# Patient Record
Sex: Male | Born: 1960 | Race: Black or African American | Hispanic: No | Marital: Married | Smoking: Former smoker
Health system: Southern US, Community
[De-identification: ages and names within clinical notes are randomized; demographics above are authoritative.]

## PROBLEM LIST (undated history)

## (undated) DIAGNOSIS — N401 Enlarged prostate with lower urinary tract symptoms: Secondary | ICD-10-CM

## (undated) DIAGNOSIS — I10 Essential (primary) hypertension: Secondary | ICD-10-CM

## (undated) DIAGNOSIS — G4730 Sleep apnea, unspecified: Secondary | ICD-10-CM

## (undated) DIAGNOSIS — G454 Transient global amnesia: Secondary | ICD-10-CM

## (undated) DIAGNOSIS — R7303 Prediabetes: Secondary | ICD-10-CM

## (undated) DIAGNOSIS — E7800 Pure hypercholesterolemia, unspecified: Secondary | ICD-10-CM

## (undated) HISTORY — PX: NO PAST SURGERIES: SHX2092

## (undated) HISTORY — PX: COLONOSCOPY: SHX174

---

## 2009-06-12 ENCOUNTER — Ambulatory Visit: Payer: Self-pay | Admitting: Otolaryngology

## 2010-11-08 ENCOUNTER — Ambulatory Visit: Payer: Self-pay

## 2011-08-12 ENCOUNTER — Ambulatory Visit: Payer: Self-pay | Admitting: Otolaryngology

## 2011-08-26 ENCOUNTER — Ambulatory Visit: Payer: Self-pay | Admitting: Otolaryngology

## 2012-11-17 ENCOUNTER — Ambulatory Visit: Payer: Self-pay | Admitting: Gastroenterology

## 2015-03-22 ENCOUNTER — Encounter: Payer: Self-pay | Admitting: Emergency Medicine

## 2015-03-22 ENCOUNTER — Inpatient Hospital Stay
Admission: EM | Admit: 2015-03-22 | Discharge: 2015-03-23 | DRG: 069 | Disposition: A | Discharge status: Home / Self Care | Payer: BC Managed Care – PPO | Attending: Internal Medicine | Admitting: Internal Medicine

## 2015-03-22 ENCOUNTER — Emergency Department: Payer: BC Managed Care – PPO

## 2015-03-22 DIAGNOSIS — Z79899 Other long term (current) drug therapy: Secondary | ICD-10-CM

## 2015-03-22 DIAGNOSIS — Z7982 Long term (current) use of aspirin: Secondary | ICD-10-CM

## 2015-03-22 DIAGNOSIS — Z87891 Personal history of nicotine dependence: Secondary | ICD-10-CM | POA: Diagnosis not present

## 2015-03-22 DIAGNOSIS — E785 Hyperlipidemia, unspecified: Secondary | ICD-10-CM | POA: Diagnosis present

## 2015-03-22 DIAGNOSIS — I10 Essential (primary) hypertension: Secondary | ICD-10-CM | POA: Diagnosis present

## 2015-03-22 DIAGNOSIS — G459 Transient cerebral ischemic attack, unspecified: Secondary | ICD-10-CM | POA: Diagnosis not present

## 2015-03-22 DIAGNOSIS — G4733 Obstructive sleep apnea (adult) (pediatric): Secondary | ICD-10-CM | POA: Diagnosis present

## 2015-03-22 HISTORY — DX: Essential (primary) hypertension: I10

## 2015-03-22 LAB — BASIC METABOLIC PANEL
ANION GAP: 13 (ref 5–15)
BUN: 22 mg/dL — AB (ref 6–20)
CHLORIDE: 100 mmol/L — AB (ref 101–111)
CO2: 23 mmol/L (ref 22–32)
Calcium: 9.4 mg/dL (ref 8.9–10.3)
Creatinine, Ser: 0.94 mg/dL (ref 0.61–1.24)
GFR calc Af Amer: >60 mL/min (ref >60)
GFR calc non Af Amer: >60 mL/min (ref >60)
Glucose, Bld: 103 mg/dL — ABNORMAL HIGH (ref 65–99)
Potassium: 3 mmol/L — ABNORMAL LOW (ref 3.5–5.1)
Sodium: 136 mmol/L (ref 135–145)

## 2015-03-22 LAB — CBC
HEMATOCRIT: 44.8 % (ref 40.0–52.0)
HEMOGLOBIN: 14.8 g/dL (ref 13.0–18.0)
MCH: 27.2 pg (ref 26.0–34.0)
MCHC: 33 g/dL (ref 32.0–36.0)
MCV: 82.3 fL (ref 80.0–100.0)
Platelets: 203 10*3/uL (ref 150–440)
RBC: 5.45 MIL/uL (ref 4.40–5.90)
RDW: 14.4 % (ref 11.5–14.5)
WBC: 6.6 10*3/uL (ref 3.8–10.6)

## 2015-03-22 MED ORDER — ASPIRIN EC 81 MG PO TBEC
81.0000 mg | DELAYED_RELEASE_TABLET | Freq: Every day | ORAL | Status: DC
  Administered 2015-03-23: 81 mg via ORAL
  Filled 2015-03-22: qty 1

## 2015-03-22 MED ORDER — ENOXAPARIN SODIUM 40 MG/0.4ML ~~LOC~~ SOLN
40.0000 mg | Freq: Two times a day (BID) | SUBCUTANEOUS | Status: DC
  Administered 2015-03-22 – 2015-03-23 (×2): 40 mg via SUBCUTANEOUS
  Filled 2015-03-22 (×2): qty 0.4

## 2015-03-22 MED ORDER — BENAZEPRIL HCL 20 MG PO TABS
40.0000 mg | ORAL_TABLET | Freq: Every day | ORAL | Status: DC
  Administered 2015-03-23: 40 mg via ORAL
  Filled 2015-03-22 (×2): qty 2

## 2015-03-22 MED ORDER — ACETAMINOPHEN 325 MG PO TABS
650.0000 mg | ORAL_TABLET | ORAL | Status: DC | PRN
  Administered 2015-03-23: 650 mg via ORAL
  Filled 2015-03-22: qty 2

## 2015-03-22 MED ORDER — NEBIVOLOL HCL 10 MG PO TABS
10.0000 mg | ORAL_TABLET | Freq: Every day | ORAL | Status: DC
  Administered 2015-03-23: 10 mg via ORAL
  Filled 2015-03-22: qty 1

## 2015-03-22 MED ORDER — AMLODIPINE BESYLATE 10 MG PO TABS
10.0000 mg | ORAL_TABLET | Freq: Every day | ORAL | Status: DC
  Administered 2015-03-23: 10 mg via ORAL
  Filled 2015-03-22: qty 1

## 2015-03-22 MED ORDER — STROKE: EARLY STAGES OF RECOVERY BOOK
Freq: Once | Status: AC
  Administered 2015-03-23: 03:00:00

## 2015-03-22 MED ORDER — HYDRALAZINE HCL 25 MG PO TABS
25.0000 mg | ORAL_TABLET | Freq: Three times a day (TID) | ORAL | Status: DC
  Administered 2015-03-22 – 2015-03-23 (×3): 25 mg via ORAL
  Filled 2015-03-22 (×3): qty 1

## 2015-03-22 NOTE — ED Provider Notes (Signed)
[facility] Emergency Department Provider Note  ____________________________________________  Time seen: Approximately 9:03 PM  I have reviewed the triage vital signs and the nursing notes.   HISTORY  Chief Complaint Near Syncope    HPI Shane MarseillesMelvin Todorov Jr. is a 54 y.o. 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 Hypertension     There are no active problems to display for this patient.   History reviewed. No pertinent past surgical history.  Current Outpatient Rx  Name  Route  Sig  Dispense  Refill  . amLODipine (NORVASC) 10 MG tablet   Oral   Take 10 mg by mouth daily.         Marland Kitchen. amoxicillin-clavulanate (AUGMENTIN) 875-125 MG per tablet   Oral   Take 1 tablet by mouth 2 (two) times daily.         Marland Kitchen. aspirin EC 81 MG tablet   Oral   Take 81 mg by mouth daily.         . benazepril (LOTENSIN) 40 MG tablet   Oral   Take 40 mg by mouth daily.         . hydrALAZINE (APRESOLINE) 25 MG tablet   Oral   Take 25 mg by mouth 3 (three) times daily.         . hydrochlorothiazide (HYDRODIURIL) 25 MG tablet   Oral   Take 25 mg by mouth daily.         . Multiple Vitamins-Minerals (MULTIVITAMIN PO)   Oral   Take 1 tablet by mouth daily.         . naproxen sodium  (ANAPROX) 220 MG tablet   Oral   Take 440 mg by mouth every morning.         . nebivolol (BYSTOLIC) 10 MG tablet   Oral   Take 10 mg by mouth daily.         . Omega-3 Fatty Acids (FISH OIL PO)   Oral   Take 2 capsules by mouth daily.           Allergies Review of patient's allergies indicates no known allergies.  No family history on file.  Social History History  Substance Use Topics  . Smoking status: Former Games developermoker  . Smokeless tobacco: Not on file  . Alcohol Use: No     Constitutional: No fever/chills Eyes: No visual changes. ENT: No sore throat. Cardiovascular: Denies chest pain. Respiratory: Denies shortness of breath. Gastrointestinal: No abdominal pain.  No nausea, no vomiting.  No diarrhea.  No constipation. Genitourinary: Negative for dysuria. Musculoskeletal: Negative for back pain. Skin: Negative for rash.  Review of systems is otherwise negative except as specified in history of present illness ____________________________________________   PHYSICAL  EXAM:  VITAL SIGNS: ED Triage Vitals  Enc Vitals Group     BP 03/22/15 1605 156/82 mmHg     Pulse Rate 03/22/15 1605 86     Resp 03/22/15 2020 18     Temp 03/22/15 1605 98.3 F (36.8 C)     Temp Source 03/22/15 1605 Oral     SpO2 03/22/15 1605 100 %     Weight 03/22/15 1605 288 lb (130.636 kg)     Height 03/22/15 1605 5\' 9"  (1.753 m)     Head Cir --      Peak Flow --      Pain Score 03/22/15 1606 0     Pain Loc --      Pain Edu? --      Excl. in GC? --    Constitutional: Alert and oriented. Well appearing and in no acute distress. Eyes: Conjunctivae are normal. PERRL. EOMI. endoscopic exam normal Head: Atraumatic. Nose: No congestion/rhinnorhea. Mouth/Throat: Mucous membranes are moist.  Oropharynx non-erythematous. Neck: No stridor. No carotid bruit Cardiovascular: Normal rate, regular rhythm. Grossly normal heart sounds.  Good peripheral circulation. Respiratory: Normal respiratory  effort.  No retractions. Lungs CTAB. Gastrointestinal: Soft and nontender. No distention. No abdominal bruits. No CVA tenderness. }Musculoskeletal: No lower extremity tenderness nor edema.  No joint effusions. Neurologic:  Normal speech and language. No gross focal neurologic deficits are appreciated. Speech is normal. No gait instability. Skin:  Skin is warm, dry and intact. No rash noted. Psychiatric: Mood and affect are normal. Speech and behavior are normal.  ____________________________________________   LABS (all labs ordered are listed, but only abnormal results are displayed)  Labs Reviewed  BASIC METABOLIC PANEL - Abnormal; Notable for the following:    Potassium 3.0 (*)    Chloride 100 (*)    Glucose, Bld 103 (*)    BUN 22 (*)    All other components within normal limits  CBC   ____________________________________________  EKG  EKG normal sinus rhythm normal axis and nonspecific ST-T wave changes ____________________________________________  RADIOLOGY  CT is read as normal by radiology ____________________________________________   PROCEDURES  Procedure(s) performed: None  Critical Care performed: No  ____________________________________________   INITIAL IMPRESSION / ASSESSMENT AND PLAN / ED COURSE  Pertinent labs & imaging results that were available during my care of the patient were reviewed by me and considered in my medical decision making (see chart for details).   ____________________________________________   FINAL CLINICAL IMPRESSION(S) / ED DIAGNOSES  Final diagnoses:  Transient cerebral ischemia, unspecified transient cerebral ischemia type     Arnaldo NatalPaul F Jerimie Mancuso, MD 03/22/15 2107

## 2015-03-22 NOTE — H&P (Addendum)
[facility] [place] [facility]   PATIENT NAME: Shane Smith    MR#:  [id number]  DATE OF BIRTH:  [birth year]  DATE OF ADMISSION:  03/22/2015  PRIMARY CARE PHYSICIAN: No primary care provider on file.   REQUESTING/REFERRING PHYSICIAN: Malinda  CHIEF COMPLAINT:   Chief Complaint  Patient presents with  . Near Syncope    HISTORY OF PRESENT ILLNESS:  Shane Smith  is a 54 y.o. male who presents with episode of sensation of loss of balance and "feeling odd". 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 He never had any focal weakness in any particular limb, but he just felt like he could not walk normally or use his upper extremities normally. He did endorse some numbness sensation on top of his head. He states that he did not feel comfortable driving her car due to his status, and so he called for EMS to pick him up and bring him in for evaluation. In the ED he was noted to have some dysarthria. His symptoms resolved shortly after arrival to the ED. Labs large and unremarkable, CT head unremarkable, hospitalists were called for admission for workup of TIA given his ABCD 2 score of 4.  PAST MEDICAL HISTORY:   Past Medical History  Diagnosis Date  . Hypertension     PAST SURGICAL HISTORY:   Past Surgical History  Procedure Laterality Date  . No past surgeries      SOCIAL HISTORY:   History  Substance Use Topics  . Smoking status: Former Smoker    Quit date: 03/21/1996  . Smokeless tobacco: Not on file  . Alcohol Use: No    FAMILY HISTORY:   Family History  Problem Relation Age of Onset  . Lupus Mother     DRUG ALLERGIES:  No Known Allergies  MEDICATIONS AT HOME:   Prior to Admission medications   Medication Sig Start Date End Date Taking? Authorizing Provider  amLODipine (NORVASC) 10 MG tablet Take 10 mg by mouth daily.   Yes Historical  Provider, MD  amoxicillin-clavulanate (AUGMENTIN) 875-125 MG per tablet Take 1 tablet by mouth 2 (two) times daily. 03/13/15 03/23/15 Yes Historical Provider, MD  aspirin EC 81 MG tablet Take 81 mg by mouth daily.   Yes Historical Provider, MD  benazepril (LOTENSIN) 40 MG tablet Take 40 mg by mouth daily.   Yes Historical Provider, MD  hydrALAZINE (APRESOLINE) 25 MG tablet Take 25 mg by mouth 3 (three) times daily.   Yes Historical Provider, MD  hydrochlorothiazide (HYDRODIURIL) 25 MG tablet Take 25 mg by mouth daily.   Yes Historical Provider, MD  Multiple Vitamins-Minerals (MULTIVITAMIN PO) Take 1 tablet by mouth daily.   Yes Historical Provider, MD  naproxen sodium (ANAPROX) 220 MG tablet Take 440 mg by mouth every morning.   Yes Historical Provider, MD  nebivolol (BYSTOLIC) 10 MG tablet Take 10 mg by mouth daily.   Yes Historical Provider, MD  Omega-3 Fatty Acids (FISH OIL PO) Take 2 capsules by mouth daily.   Yes Historical Provider, MD    REVIEW OF SYSTEMS:  Review of Systems  Constitutional: Negative for fever, chills, weight loss and malaise/fatigue.  HENT: Positive for ear pain (right). Negative for hearing loss and tinnitus.   Eyes: Negative for blurred vision, double vision, pain and redness.  Respiratory: Negative for cough, hemoptysis and shortness of breath.   Cardiovascular:  Negative for chest pain, palpitations, orthopnea and leg swelling.  Gastrointestinal: Negative for nausea, vomiting, abdominal pain, diarrhea and constipation.  Genitourinary: Negative for dysuria, frequency and hematuria.  Skin:       No acne, rash, or lesions  Neurological: Positive for sensory change and speech change. Negative for dizziness, tremors and focal weakness.  Endo/Heme/Allergies: Negative for polydipsia. Does not bruise/bleed easily.  Psychiatric/Behavioral: Negative for depression. The patient is not nervous/anxious and does not have insomnia.      VITAL SIGNS:   Filed Vitals:   03/22/15  2100 03/22/15 2115 03/22/15 2130 03/22/15 2200  BP: 172/105 160/90 144/94 156/87  Pulse: 60 71 69 69  Temp:      TempSrc:      Resp: 18 18 18 18   Height:      Weight:      SpO2: 100% 100% 92% 98%   Wt Readings from Last 3 Encounters:  03/22/15 130.636 kg (288 lb)    PHYSICAL EXAMINATION:  Physical Exam  Constitutional: He is oriented to person, place, and time. He appears well-developed and well-nourished. No distress.  HENT:  Head: Normocephalic and atraumatic.  Mouth/Throat: Oropharynx is clear and moist.  Right chronic stable submandibular cyst  Eyes: Conjunctivae and EOM are normal. Pupils are equal, round, and reactive to light. Right eye exhibits no discharge. Left eye exhibits no discharge. No scleral icterus.  Neck: Normal range of motion. Neck supple. No JVD present. No tracheal deviation present. Thyromegaly present.  Cardiovascular: Normal rate, regular rhythm, normal heart sounds and intact distal pulses.  Exam reveals no gallop and no friction rub.   No murmur heard. Respiratory: Effort normal and breath sounds normal. No respiratory distress. He has no wheezes. He has no rales.  GI: Soft. Bowel sounds are normal. He exhibits no distension. There is no tenderness.  Musculoskeletal: Normal range of motion. He exhibits no edema or tenderness.  Lymphadenopathy:    He has no cervical adenopathy.  Neurological: He is alert and oriented to person, place, and time. No cranial nerve deficit. Coordination normal.  Mild left facial droop, otherwise strength is intact and equal throughout all extremities, with full resolution currently about prior symptoms.  Skin: Skin is warm and dry. No rash noted. No erythema.  Psychiatric: He has a normal mood and affect. His behavior is normal. Judgment and thought content normal.    LABORATORY PANEL:   CBC  Recent Labs Lab 03/22/15 1618  WBC 6.6  HGB 14.8  HCT 44.8  PLT 203    ------------------------------------------------------------------------------------------------------------------  Chemistries   Recent Labs Lab 03/22/15 1618  NA 136  K 3.0*  CL 100*  CO2 23  GLUCOSE 103*  BUN 22*  CREATININE 0.94  CALCIUM 9.4   ------------------------------------------------------------------------------------------------------------------  Cardiac Enzymes No results for input(s): TROPONINI in the last 168 hours. ------------------------------------------------------------------------------------------------------------------  RADIOLOGY:  Ct Head Wo Contrast  03/22/2015   CLINICAL DATA:  Dizziness.  Vertigo.  Recent ear infection.  EXAM: CT HEAD WITHOUT CONTRAST  TECHNIQUE: Contiguous axial images were obtained from the base of the skull through the vertex without intravenous contrast.  COMPARISON:  None.  FINDINGS: The brainstem, cerebellum, cerebral peduncles, thalamus, basal ganglia, basilar cisterns, and ventricular system appear within normal limits. No intracranial hemorrhage, mass lesion, or acute CVA. New  The middle ears currently appear clear. Mastoid air cells appear clear. Internal auditory canals appear symmetric. Visualized paranasal sinuses appear clear.  IMPRESSION: 1. No specific abnormality is identified to explain the patient's symptoms. Currently  no definite fluid is identified in either middle ear.   Electronically Signed   By: Gaylyn Rong M.D.   On: 03/22/2015 16:57    EKG:   Orders placed or performed during the hospital encounter of 03/22/15  . ED EKG  . ED EKG    IMPRESSION AND PLAN:  Principal Problem:   TIA (transient ischemic attack) - with higher ABCD 2 score, we will bring the patient in to work him up for his TIA. Standard workup with fasting lipid panel, MRI and MRA brain, carotid Dopplers, echocardiogram, neurology consultation. Active Problems:   Hypertension - chronic stable problem, treated with home meds.    OSA (obstructive sleep apnea) - patient states he was initially given a CPAP machine, but has was unable to tolerate it and has not been using one since that time. Patient will be on telemetry here and can be monitored for apneic episodes.  All the records are reviewed and case discussed with ED provider. Management plans discussed with the patient and/or family.  DVT PROPHYLAXIS: SubQ lovenox  CODE STATUS: Full  TOTAL TIME TAKING CARE OF THIS PATIENT: 50 minutes.    Shane Smith 03/22/2015, 10:39 PM  [facility]  Office  [phone]  CC: Primary care physician; No primary care provider on file.

## 2015-03-22 NOTE — ED Notes (Signed)
MD at bedside. 

## 2015-03-22 NOTE — ED Notes (Signed)
Pt passed swallow study no trouble drinking water pt to be admitted

## 2015-03-22 NOTE — ED Notes (Signed)
Reports feeling dizzy at work today.  States he was dx with vertigo and ear infection last week.  Currently taking antibiotics and prescribed meclizine but not taking it

## 2015-03-23 ENCOUNTER — Inpatient Hospital Stay: Payer: BC Managed Care – PPO

## 2015-03-23 ENCOUNTER — Inpatient Hospital Stay
Admit: 2015-03-23 | Discharge: 2015-03-23 | Disposition: A | Discharge status: Home / Self Care | Payer: BC Managed Care – PPO | Attending: Internal Medicine | Admitting: Internal Medicine

## 2015-03-23 LAB — BASIC METABOLIC PANEL
Anion gap: 7 (ref 5–15)
BUN: 16 mg/dL (ref 6–20)
CO2: 30 mmol/L (ref 22–32)
CREATININE: 0.83 mg/dL (ref 0.61–1.24)
Calcium: 9.2 mg/dL (ref 8.9–10.3)
Chloride: 105 mmol/L (ref 101–111)
GFR calc Af Amer: >60 mL/min (ref >60)
GFR calc non Af Amer: >60 mL/min (ref >60)
GLUCOSE: 99 mg/dL (ref 65–99)
Potassium: 3.7 mmol/L (ref 3.5–5.1)
Sodium: 142 mmol/L (ref 135–145)

## 2015-03-23 LAB — CBC
HCT: 44 % (ref 40.0–52.0)
HEMOGLOBIN: 14.2 g/dL (ref 13.0–18.0)
MCH: 27.2 pg (ref 26.0–34.0)
MCHC: 32.3 g/dL (ref 32.0–36.0)
MCV: 84.2 fL (ref 80.0–100.0)
Platelets: 184 10*3/uL (ref 150–440)
RBC: 5.23 MIL/uL (ref 4.40–5.90)
RDW: 15.2 % — AB (ref 11.5–14.5)
WBC: 7.1 10*3/uL (ref 3.8–10.6)

## 2015-03-23 LAB — URINALYSIS COMPLETE WITH MICROSCOPIC (ARMC ONLY)
Bacteria, UA: NONE SEEN
Bilirubin Urine: NEGATIVE
Glucose, UA: NEGATIVE mg/dL
HGB URINE DIPSTICK: NEGATIVE
Leukocytes, UA: NEGATIVE
NITRITE: NEGATIVE
PROTEIN: NEGATIVE mg/dL
SPECIFIC GRAVITY, URINE: 1.005 (ref 1.005–1.030)
Squamous Epithelial / LPF: NONE SEEN
pH: 7 (ref 5.0–8.0)

## 2015-03-23 LAB — LIPID PANEL
CHOL/HDL RATIO: 4.1 ratio
Cholesterol: 185 mg/dL (ref 0–200)
HDL: 45 mg/dL (ref >40)
LDL Cholesterol: 128 mg/dL — ABNORMAL HIGH (ref 0–99)
TRIGLYCERIDES: 61 mg/dL (ref <150)
VLDL: 12 mg/dL (ref 0–40)

## 2015-03-23 LAB — URINE DRUG SCREEN, QUALITATIVE (ARMC ONLY)
AMPHETAMINES, UR SCREEN: NOT DETECTED
BENZODIAZEPINE, UR SCRN: NOT DETECTED
Barbiturates, Ur Screen: NOT DETECTED
Cannabinoid 50 Ng, Ur ~~LOC~~: NOT DETECTED
Cocaine Metabolite,Ur ~~LOC~~: NOT DETECTED
MDMA (Ecstasy)Ur Screen: NOT DETECTED
METHADONE SCREEN, URINE: NOT DETECTED
Opiate, Ur Screen: NOT DETECTED
Phencyclidine (PCP) Ur S: NOT DETECTED
TRICYCLIC, UR SCREEN: NOT DETECTED

## 2015-03-23 LAB — HEMOGLOBIN A1C: Hgb A1c MFr Bld: 5.4 % (ref 4.0–6.0)

## 2015-03-23 MED ORDER — ROSUVASTATIN CALCIUM 20 MG PO TABS: 20.0000 mg | ORAL_TABLET | Freq: Every day | ORAL | Status: DC

## 2015-03-23 MED ORDER — ASPIRIN EC 325 MG PO TBEC: 325.0000 mg | DELAYED_RELEASE_TABLET | Freq: Every day | ORAL | Status: DC

## 2015-03-23 NOTE — Progress Notes (Signed)
Discharge instructions given. IV and tele discontinued. Patient was not positive for a stroke, but given education on stroke prevention as well as crestor education. Follow up appointment set up with PCP. Patient and wife have no further questions.

## 2015-03-23 NOTE — Discharge Summary (Signed)
[facility] -  at [facility]   PATIENT NAME: Shane Smith    MR#:  [id number]  DATE OF BIRTH:  Dec 01, 1960  DATE OF ADMISSION:  03/22/2015 ADMITTING PHYSICIAN: Oralia Manis, MD  DATE OF DISCHARGE: No discharge date for patient encounter.  PRIMARY CARE PHYSICIAN: Out of town  ADMISSION DIAGNOSIS:  Transient cerebral ischemia, unspecified transient cerebral ischemia type [G45.9]  DISCHARGE DIAGNOSIS:  Principal Problem:   TIA (transient ischemic attack) Hyperlipidemia Active Problems:   Hypertension   OSA (obstructive sleep apnea)   SECONDARY DIAGNOSIS:   Past Medical History  Diagnosis Date  . Hypertension     CONSULTS OBTAINED:    None @ None   HOSPITAL COURSE:  Please review history and physical for details. Patient is admitted to the hospital with a chief complaint of dizziness. Admitted to the hospital to rule out TIA TIA -Today patient is complaining of right ear discomfort, but denies any dysphagia or dysarthria or weakness. Feeling much better. No weakness in his extremities. Patient Reported that his recently diagnosed with vertigo by his primary care physician and also he was placed on Augmentin for fluid built in his middle ear. The right ear pain is thought to be from the wax in his external ear and fluid built in his middle ear, which can also contact reviewed vertigo leading to dizziness. Patient was suggested to complete the antibiotic course prescribed by his primary care physician and follow-up with them in a week.  Patient's CT head is negative, carotid Dopplers and 2-D echocardiogram are also normal. MRI and MRA of the brain has revealed no acute infarcts. It also has revealed 15 mm mass in the right cheek probably dermal inclusion cyst. This finding was discussed with the patient and his family and with this. He was given an option to remove the cyst in the past but he refused to do so as its not bothering  him. Primary care physician to follow-up on this. Patient's aspirin is increased to 325 mg enteric-coated to take by mouth once daily. Patient is restarted on Crestor as his LDL is elevated at 125  Hyperlipidemia= LDL is elevated resume  Crestor  Hypertension - chronic stable problem, treated with home meds.    OSA (obstructive sleep apnea) - patient states he was initially given a CPAP machine, but has was unable to tolerate it and has not been using one since that time. Outpatient follow-up with primary care physician  Patient is getting discharged under satisfactory condition    DISCHARGE CONDITIONS:   Satisfactory    DRUG ALLERGIES:  No Known Allergies  DISCHARGE MEDICATIONS:   Current Discharge Medication List    START taking these medications   Details  rosuvastatin (CRESTOR) 20 MG tablet Take 1 tablet (20 mg total) by mouth daily. Qty: 30 tablet, Refills: 0      CONTINUE these medications which have CHANGED   Details  aspirin EC 325 MG tablet Take 1 tablet (325 mg total) by mouth daily. Qty: 30 tablet, Refills: 0      CONTINUE these medications which have NOT CHANGED   Details  amLODipine (NORVASC) 10 MG tablet Take 10 mg by mouth daily.    amoxicillin-clavulanate (AUGMENTIN) 875-125 MG per tablet Take 1 tablet by mouth 2 (two) times daily.    benazepril (LOTENSIN) 40 MG tablet Take 40 mg by mouth daily.    hydrALAZINE (APRESOLINE) 25 MG tablet Take 25 mg by mouth 3 (three) times daily.    hydrochlorothiazide (  HYDRODIURIL) 25 MG tablet Take 25 mg by mouth daily.    Multiple Vitamins-Minerals (MULTIVITAMIN PO) Take 1 tablet by mouth daily.    naproxen sodium (ANAPROX) 220 MG tablet Take 440 mg by mouth every morning.    nebivolol (BYSTOLIC) 10 MG tablet Take 10 mg by mouth daily.    Omega-3 Fatty Acids (FISH OIL PO) Take 2 capsules by mouth daily.         DISCHARGE INSTRUCTIONS:   Follow-up with primary care physician in a week  If you  experience worsening of your admission symptoms, develop shortness of breath, life threatening emergency, suicidal or homicidal thoughts you must seek medical attention immediately by calling 911 or calling your MD immediately  if symptoms less severe.  You Must read complete instructions/literature along with all the possible adverse reactions/side effects for all the Medicines you take and that have been prescribed to you. Take any new Medicines after you have completely understood and accept all the possible adverse reactions/side effects.   Please note  You were cared for by a hospitalist during your hospital stay. If you have any questions about your discharge medications or the care you received while you were in the hospital after you are discharged, you can call the unit and asked to speak with the hospitalist on call if the hospitalist that took care of you is not available. Once you are discharged, your primary care physician will handle any further medical issues. Please note that NO REFILLS for any discharge medications will be authorized once you are discharged, as it is imperative that you return to your primary care physician (or establish a relationship with a primary care physician if you do not have one) for your aftercare needs so that they can reassess your need for medications and monitor your lab values.    Today   CHIEF COMPLAINT:   Chief Complaint  Patient presents with  . Near Syncope    complaining of right ear discomfort. Denies any other complaints  Review of Systems  Constitutional: Negative for fever, chills, weight loss and malaise/fatigue.  HENT: Positive for ear pain (right). Negative for hearing loss and tinnitus.  Eyes: Negative for blurred vision, double vision, pain and redness.  Respiratory: Negative for cough, hemoptysis and shortness of breath.  Cardiovascular: Negative for chest pain, palpitations, orthopnea and leg swelling.  Gastrointestinal:  Negative for nausea, vomiting, abdominal pain, diarrhea and constipation.  Genitourinary: Negative for dysuria, frequency and hematuria.  Skin:   No acne, rash, or lesions  Neurological: Denies any dysphagia or dysarthria today. Negative for dizziness, tremors and focal weakness.  Endo/Heme/Allergies: Negative for polydipsia. Does not bruise/bleed easily.  Psychiatric/Behavioral: Negative for depression. The patient is not nervous/anxious and does not have insomnia.  VITAL SIGNS:  Blood pressure 128/74, pulse 64, temperature 98.2 F (36.8 C), temperature source Oral, resp. rate 22, height 5\' 9"  (1.753 m), weight 126.055 kg (277 lb 14.4 oz), SpO2 98 %.   PHYSICAL EXAMINATION:  GENERAL:  54 y.o.-year-old patient lying in the bed with no acute distress.  EYES: Pupils equal, round, reactive to light and accommodation. No scleral icterus. Extraocular muscles intact.  HEENT: Head atraumatic, normocephalic. Oropharynx and nasopharynx clear. Right ear canal is occluded with wax. Could not visualize tympanic membrane NECK:  Supple, no jugular venous distention. No thyroid enlargement, no tenderness.  LUNGS: Normal breath sounds bilaterally, no wheezing, rales,rhonchi or crepitation. No use of accessory muscles of respiration.  CARDIOVASCULAR: S1, S2 normal. No murmurs, rubs, or  gallops.  ABDOMEN: Soft, non-tender, non-distended. Bowel sounds present. No organomegaly or mass.  EXTREMITIES: No pedal edema, cyanosis, or clubbing.  NEUROLOGIC: Cranial nerves II through XII are intact. Muscle strength 5/5 in all extremities. Sensation intact. Gait not checked.  PSYCHIATRIC: The patient is alert and oriented x 3.  SKIN: No obvious rash, lesion, or ulcer.   DATA REVIEW:   CBC  Recent Labs Lab 03/23/15 0518  WBC 7.1  HGB 14.2  HCT 44.0  PLT 184    Chemistries   Recent Labs Lab 03/23/15 0518  NA 142  K 3.7  CL 105  CO2 30  GLUCOSE 99  BUN 16  CREATININE 0.83  CALCIUM 9.2     Cardiac Enzymes No results for input(s): TROPONINI in the last 168 hours.  Microbiology Results  No results found for this or any previous visit.  RADIOLOGY:  Ct Head Wo Contrast  03/22/2015   CLINICAL DATA:  Dizziness.  Vertigo.  Recent ear infection.  EXAM: CT HEAD WITHOUT CONTRAST  TECHNIQUE: Contiguous axial images were obtained from the base of the skull through the vertex without intravenous contrast.  COMPARISON:  None.  FINDINGS: The brainstem, cerebellum, cerebral peduncles, thalamus, basal ganglia, basilar cisterns, and ventricular system appear within normal limits. No intracranial hemorrhage, mass lesion, or acute CVA. New  The middle ears currently appear clear. Mastoid air cells appear clear. Internal auditory canals appear symmetric. Visualized paranasal sinuses appear clear.  IMPRESSION: 1. No specific abnormality is identified to explain the patient's symptoms. Currently no definite fluid is identified in either middle ear.   Electronically Signed   By: Gaylyn Rong M.D.   On: 03/22/2015 16:57   Mri Brain Without Contrast  03/23/2015   CLINICAL DATA:  TIA with dizziness and headache yesterday.  EXAM: MRI HEAD WITHOUT CONTRAST  MRA HEAD WITHOUT CONTRAST  TECHNIQUE: Multiplanar, multiecho pulse sequences of the brain and surrounding structures were obtained without intravenous contrast. Angiographic images of the head were obtained using MRA technique without contrast.  COMPARISON:  Head CT from yesterday  FINDINGS: MRI HEAD FINDINGS  Partly visualized subcutaneous T2 hyperintense and T1 hypointense mass in the subcutaneous right cheek, epicenter in the subcutaneous fat rather than neighboring parotid tail.  Calvarium and upper cervical spine: No marrow signal abnormality.  Orbits: No significant findings.  Sinuses: Clear. Mastoid and middle ears are clear.  Brain: No acute abnormality such as acute infarct, hemorrhage, hydrocephalus, or mass lesion. No evidence of large vessel  occlusion. No white matter disease.  MRA HEAD FINDINGS  Mild left carotid dominance related hypoplastic right A1. Anterior communicating artery is present. Left vertebral artery dominance. Common origin left PCA and superior cerebellar arteries.  Weak signal, especially notable on reformats, in the right V4 segment is not related to an inflow stenosis as there is symmetric robust signal in the vertebrals at the skullbase and there is no focal wasting suggestive of a high-grade proximal stenosis. This may be from developmentally diminutive vessel size.  Apparent diffuse moderate to high-grade narrowing of the right M1 and M2 vessels with promptly normal flow in the upper operculum branches is favored artifactual. No causative inflow stenosis is identified.  No evidence of aneurysm or vascular malformation.  IMPRESSION: 1. No acute intracranial findings, including infarct. 2. Apparent diffuse moderate and advanced right M1 and M2 narrowing is likely artifactual, but not clearly explained. Given patient's headache and unexplained neurologic symptoms, consider CTA of the head to exclude an intracranial dissection or other flow  limiting stenosis. 3. Partly visible at least 15 mm mass in the right cheek, likely subcutaneous and thus favored to represent dermal inclusion cyst. Exophytic parotid tail mass is the main consideration, correlate with physical exam.   Electronically Signed   By: Marnee Spring M.D.   On: 03/23/2015 12:24   US Carotid Bilateral  03/23/2015   CLINICAL DATA:  TIA.  EXAM: BILATERAL CAROTID DUPLEX ULTRASOUND  TECHNIQUE: Wallace Cullens scale imaging, color Doppler and duplex ultrasound were performed of bilateral carotid and vertebral arteries in the neck.  COMPARISON:  None.  FINDINGS: Criteria: Quantification of carotid stenosis is based on velocity parameters that correlate the residual internal carotid diameter with NASCET-based stenosis levels, using the diameter of the distal internal carotid lumen as  the denominator for stenosis measurement.  The following velocity measurements were obtained:  RIGHT  ICA:  73/24 cm/sec  CCA:  114/25 cm/sec  SYSTOLIC ICA/CCA RATIO:  0.6  DIASTOLIC ICA/CCA RATIO:  1.0  ECA:  91 cm/sec  LEFT  ICA:  93/41 cm/sec  CCA:  87/25 cm/sec  SYSTOLIC ICA/CCA RATIO:  1.1  DIASTOLIC ICA/CCA RATIO:  1.6  ECA:  141 cm/sec  RIGHT CAROTID ARTERY: No significant atherosclerotic vascular disease noted. No flow limiting stenosis.  RIGHT VERTEBRAL ARTERY:  Patent antegrade flow.  LEFT CAROTID ARTERY: No significant atherosclerotic vascular disease. No flow limiting stenosis.  LEFT VERTEBRAL ARTERY:  Patent antegrade flow.  IMPRESSION: No significant carotid atherosclerotic vascular disease. No flow limiting stenosis. The vertebrals are patent with antegrade flow.   Electronically Signed   By: Maisie Fus  Register   On: 03/23/2015 12:01   Mr Maxine Glenn Head/brain Wo Cm  03/23/2015   CLINICAL DATA:  TIA with dizziness and headache yesterday.  EXAM: MRI HEAD WITHOUT CONTRAST  MRA HEAD WITHOUT CONTRAST  TECHNIQUE: Multiplanar, multiecho pulse sequences of the brain and surrounding structures were obtained without intravenous contrast. Angiographic images of the head were obtained using MRA technique without contrast.  COMPARISON:  Head CT from yesterday  FINDINGS: MRI HEAD FINDINGS  Partly visualized subcutaneous T2 hyperintense and T1 hypointense mass in the subcutaneous right cheek, epicenter in the subcutaneous fat rather than neighboring parotid tail.  Calvarium and upper cervical spine: No marrow signal abnormality.  Orbits: No significant findings.  Sinuses: Clear. Mastoid and middle ears are clear.  Brain: No acute abnormality such as acute infarct, hemorrhage, hydrocephalus, or mass lesion. No evidence of large vessel occlusion. No white matter disease.  MRA HEAD FINDINGS  Mild left carotid dominance related hypoplastic right A1. Anterior communicating artery is present. Left vertebral artery dominance.  Common origin left PCA and superior cerebellar arteries.  Weak signal, especially notable on reformats, in the right V4 segment is not related to an inflow stenosis as there is symmetric robust signal in the vertebrals at the skullbase and there is no focal wasting suggestive of a high-grade proximal stenosis. This may be from developmentally diminutive vessel size.  Apparent diffuse moderate to high-grade narrowing of the right M1 and M2 vessels with promptly normal flow in the upper operculum branches is favored artifactual. No causative inflow stenosis is identified.  No evidence of aneurysm or vascular malformation.  IMPRESSION: 1. No acute intracranial findings, including infarct. 2. Apparent diffuse moderate and advanced right M1 and M2 narrowing is likely artifactual, but not clearly explained. Given patient's headache and unexplained neurologic symptoms, consider CTA of the head to exclude an intracranial dissection or other flow limiting stenosis. 3. Partly visible at least 15 mm  mass in the right cheek, likely subcutaneous and thus favored to represent dermal inclusion cyst. Exophytic parotid tail mass is the main consideration, correlate with physical exam.   Electronically Signed   By: Marnee SpringJonathon  Watts M.D.   On: 03/23/2015 12:24    EKG:   Orders placed or performed during the hospital encounter of 03/22/15  . ED EKG  . ED EKG  . EKG 12-Lead  . EKG 12-Lead      Management plans discussed with the patient, family and they are in agreement.  CODE STATUS:     Code Status Orders        Start     Ordered   03/22/15 2319  Full code   Continuous     03/22/15 2319      TOTAL TIME TAKING CARE OF THIS PATIENT: 45 minutes.    Ramonita LabGouru, Hatsue Sime M.D on 03/23/2015 at 2:28 PM  Between 7am to 6pm - Pager - [phone]  After 6pm go to [email or web address] - password [email or web address] [facility]  Office  [phone]  CC: Primary care physician; No primary care provider on  file.

## 2015-03-23 NOTE — Progress Notes (Signed)
Dr Betti Cruzeddy called and made aware that telemetry reading SB rate 47-48 not sustained. I explained that pt was asleep when I went room to check on him. I told doctor pt stated " he suppose to take bi pap ,but he don't use". Per doctor monitor patient.

## 2015-05-20 ENCOUNTER — Emergency Department: Payer: BC Managed Care – PPO

## 2015-05-20 ENCOUNTER — Emergency Department
Admission: EM | Admit: 2015-05-20 | Discharge: 2015-05-21 | Disposition: A | Discharge status: Home / Self Care | Payer: BC Managed Care – PPO | Attending: Emergency Medicine | Admitting: Emergency Medicine

## 2015-05-20 ENCOUNTER — Other Ambulatory Visit: Payer: Self-pay

## 2015-05-20 DIAGNOSIS — Z791 Long term (current) use of non-steroidal anti-inflammatories (NSAID): Secondary | ICD-10-CM | POA: Diagnosis not present

## 2015-05-20 DIAGNOSIS — Z79899 Other long term (current) drug therapy: Secondary | ICD-10-CM | POA: Insufficient documentation

## 2015-05-20 DIAGNOSIS — R55 Syncope and collapse: Secondary | ICD-10-CM | POA: Diagnosis not present

## 2015-05-20 DIAGNOSIS — Z7982 Long term (current) use of aspirin: Secondary | ICD-10-CM | POA: Diagnosis not present

## 2015-05-20 DIAGNOSIS — Z87891 Personal history of nicotine dependence: Secondary | ICD-10-CM | POA: Diagnosis not present

## 2015-05-20 DIAGNOSIS — R51 Headache: Secondary | ICD-10-CM | POA: Diagnosis not present

## 2015-05-20 DIAGNOSIS — R42 Dizziness and giddiness: Secondary | ICD-10-CM

## 2015-05-20 DIAGNOSIS — I10 Essential (primary) hypertension: Secondary | ICD-10-CM | POA: Diagnosis not present

## 2015-05-20 LAB — CBC
HCT: 41.2 % (ref 40.0–52.0)
HEMOGLOBIN: 13.6 g/dL (ref 13.0–18.0)
MCH: 27.5 pg (ref 26.0–34.0)
MCHC: 33.1 g/dL (ref 32.0–36.0)
MCV: 83.1 fL (ref 80.0–100.0)
PLATELETS: 175 10*3/uL (ref 150–440)
RBC: 4.96 MIL/uL (ref 4.40–5.90)
RDW: 15 % — ABNORMAL HIGH (ref 11.5–14.5)
WBC: 7.7 10*3/uL (ref 3.8–10.6)

## 2015-05-20 LAB — COMPREHENSIVE METABOLIC PANEL
ALT: 24 U/L (ref 17–63)
AST: 31 U/L (ref 15–41)
Albumin: 3.9 g/dL (ref 3.5–5.0)
Alkaline Phosphatase: 50 U/L (ref 38–126)
Anion gap: 7 (ref 5–15)
BUN: 19 mg/dL (ref 6–20)
CO2: 27 mmol/L (ref 22–32)
CREATININE: 0.84 mg/dL (ref 0.61–1.24)
Calcium: 8.8 mg/dL — ABNORMAL LOW (ref 8.9–10.3)
Chloride: 104 mmol/L (ref 101–111)
GFR calc Af Amer: >60 mL/min (ref >60)
Glucose, Bld: 108 mg/dL — ABNORMAL HIGH (ref 65–99)
Potassium: 3.7 mmol/L (ref 3.5–5.1)
Sodium: 138 mmol/L (ref 135–145)
Total Bilirubin: 0.5 mg/dL (ref 0.3–1.2)
Total Protein: 8 g/dL (ref 6.5–8.1)

## 2015-05-20 LAB — DIFFERENTIAL
Basophils Absolute: 0 10*3/uL (ref 0–0.1)
Basophils Relative: 1 %
EOS ABS: 0.1 10*3/uL (ref 0–0.7)
Eosinophils Relative: 2 %
Lymphocytes Relative: 15 %
Lymphs Abs: 1.1 10*3/uL (ref 1.0–3.6)
Monocytes Absolute: 0.7 10*3/uL (ref 0.2–1.0)
Monocytes Relative: 9 %
Neutro Abs: 5.7 10*3/uL (ref 1.4–6.5)
Neutrophils Relative %: 73 %

## 2015-05-20 LAB — TROPONIN I: Troponin I: <0.03 ng/mL (ref <0.031)

## 2015-05-20 NOTE — ED Notes (Signed)
Pt with frequent urination d/t medications.

## 2015-05-20 NOTE — ED Notes (Signed)
Pt says he became dizzy at work tonight; thought it might be dehydration but says he's been drinking plenty of water; pt was seen here twice in May for similar symptoms; admitted overnight on the second visit and had multiple tests run; no definitive diagnosis on cause of dizziness; pt says still feeling some dizziness but not as bad as last visit; pt's wife says on the way into the ED pt had "trouble finding his words"; pt with clear coherent speech at this time

## 2015-05-21 LAB — APTT: APTT: 33 s (ref 24–36)

## 2015-05-21 LAB — PROTIME-INR
INR: 0.96
PROTHROMBIN TIME: 13 s (ref 11.4–15.0)

## 2015-05-21 MED ORDER — SODIUM CHLORIDE 0.9 % IV BOLUS (SEPSIS)
1000.0000 mL | Freq: Once | INTRAVENOUS | Status: AC
  Administered 2015-05-21: 1000 mL via INTRAVENOUS

## 2015-05-21 NOTE — ED Notes (Signed)
MD at bedside. 

## 2015-05-21 NOTE — ED Notes (Signed)
Patient is resting comfortably. 

## 2015-05-21 NOTE — ED Notes (Signed)
Patient denies pain and is resting comfortably.  

## 2015-05-21 NOTE — ED Notes (Signed)
Nodule noted to right neck, MD aware.

## 2015-05-21 NOTE — Discharge Instructions (Signed)
Dizziness °Dizziness is a common problem. It is a feeling of unsteadiness or light-headedness. You may feel like you are about to faint. Dizziness can lead to injury if you stumble or fall. A person of any age group can suffer from dizziness, but dizziness is more common in older adults. °CAUSES  °Dizziness can be caused by many different things, including: °· Middle ear problems. °· Standing for too long. °· Infections. °· An allergic reaction. °· Aging. °· An emotional response to something, such as the sight of blood. °· Side effects of medicines. °· Tiredness. °· Problems with circulation or blood pressure. °· Excessive use of alcohol or medicines, or illegal drug use. °· Breathing too fast (hyperventilation). °· An irregular heart rhythm (arrhythmia). °· A low red blood cell count (anemia). °· Pregnancy. °· Vomiting, diarrhea, fever, or other illnesses that cause body fluid loss (dehydration). °· Diseases or conditions such as Parkinson's disease, high blood pressure (hypertension), diabetes, and thyroid problems. °· Exposure to extreme heat. °DIAGNOSIS  °Your health care provider will ask about your symptoms, perform a physical exam, and perform an electrocardiogram (ECG) to record the electrical activity of your heart. Your health care provider may also perform other heart or blood tests to determine the cause of your dizziness. These may include: °· Transthoracic echocardiogram (TTE). During echocardiography, sound waves are used to evaluate how blood flows through your heart. °· Transesophageal echocardiogram (TEE). °· Cardiac monitoring. This allows your health care provider to monitor your heart rate and rhythm in real time. °· Holter monitor. This is a portable device that records your heartbeat and can help diagnose heart arrhythmias. It allows your health care provider to track your heart activity for several days if needed. °· Stress tests by exercise or by giving medicine that makes the heart beat  faster. °TREATMENT  °Treatment of dizziness depends on the cause of your symptoms and can vary greatly. °HOME CARE INSTRUCTIONS  °· Drink enough fluids to keep your urine clear or pale yellow. This is especially important in very hot weather. In older adults, it is also important in cold weather. °· Take your medicine exactly as directed if your dizziness is caused by medicines. When taking blood pressure medicines, it is especially important to get up slowly. °· Rise slowly from chairs and steady yourself until you feel okay. °· In the morning, first sit up on the side of the bed. When you feel okay, stand slowly while holding onto something until you know your balance is fine. °· Move your legs often if you need to stand in one place for a long time. Tighten and relax your muscles in your legs while standing. °· Have someone stay with you for 1-2 days if dizziness continues to be a problem. Do this until you feel you are well enough to stay alone. Have the person call your health care provider if he or she notices changes in you that are concerning. °· Do not drive or use heavy machinery if you feel dizzy. °· Do not drink alcohol. °SEEK IMMEDIATE MEDICAL CARE IF:  °· Your dizziness or light-headedness gets worse. °· You feel nauseous or vomit. °· You have problems talking, walking, or using your arms, hands, or legs. °· You feel weak. °· You are not thinking clearly or you have trouble forming sentences. It may take a friend or family member to notice this. °· You have chest pain, abdominal pain, shortness of breath, or sweating. °· Your vision changes. °· You notice   any bleeding. °· You have side effects from medicine that seems to be getting worse rather than better. °MAKE SURE YOU:  °· Understand these instructions. °· Will watch your condition. °· Will get help right away if you are not doing well or get worse. °Document Released: 04/23/2001 Document Revised: 11/02/2013 Document Reviewed: 05/17/2011 °[employer]®  Patient Information ©2015 [employer]. This information is not intended to replace advice given to you by your health care provider. Make sure you discuss any questions you have with your health care provider. °Near-Syncope °Near-syncope (commonly known as near fainting) is sudden weakness, dizziness, or feeling like you might pass out. During an episode of near-syncope, you may also develop pale skin, have tunnel vision, or feel sick to your stomach (nauseous). Near-syncope may occur when getting up after sitting or while standing for a long time. It is caused by a sudden decrease in blood flow to the brain. This decrease can result from various causes or triggers, most of which are not serious. However, because near-syncope can sometimes be a sign of something serious, a medical evaluation is required. The specific cause is often not determined. °HOME CARE INSTRUCTIONS  °Monitor your condition for any changes. The following actions may help to alleviate any discomfort you are experiencing: °· Have someone stay with you until you feel stable. °· Lie down right away and prop your feet up if you start feeling like you might faint. Breathe deeply and steadily. Wait until all the symptoms have passed. Most of these episodes last only a few minutes. You may feel tired for several hours.   °· Drink enough fluids to keep your urine clear or pale yellow.   °· If you are taking blood pressure or heart medicine, get up slowly when seated or lying down. Take several minutes to sit and then stand. This can reduce dizziness. °· Follow up with your health care provider as directed.  °SEEK IMMEDIATE MEDICAL CARE IF:  °· You have a severe headache.   °· You have unusual pain in the chest, abdomen, or back.   °· You are bleeding from the mouth or rectum, or you have black or tarry stool.   °· You have an irregular or very fast heartbeat.   °· You have repeated fainting or have seizure-like jerking during an episode.   °· You  faint when sitting or lying down.   °· You have confusion.   °· You have difficulty walking.   °· You have severe weakness.   °· You have vision problems.   °MAKE SURE YOU:  °· Understand these instructions. °· Will watch your condition. °· Will get help right away if you are not doing well or get worse. °Document Released: 10/28/2005 Document Revised: 11/02/2013 Document Reviewed: 04/02/2013 °Exit[employer]® Patient Information ©2015 [employer]. This information is not intended to replace advice given to you by your health care provider. Make sure you discuss any questions you have with your health care provider. ° °

## 2015-05-21 NOTE — ED Provider Notes (Signed)
[facility] Emergency Department Provider Note  ____________________________________________  Time seen: Approximately 2349 PM  I have reviewed the triage vital signs and the nursing notes.   HISTORY  Chief Complaint Dizziness    HPI Shane Smith. is a 54 y.o. male who comes in with dizziness and presyncope. 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 The patient reports that the symptoms occurred 3 times. He reports that the initial episode happened at 7:30 PM then again at 8:15 then again at 9:30. The patient's boss called his wife at 30 and the patient came in for evaluation. The patient was seen and admitted in the hospital in May for similar symptoms he had CT scans and MRIs but is unsure exactly what may have occurred. The patient though at that time had also some slurred speech and head pain. The patient was seen by his primary care physician yesterday and was told that everything looked good. According to the patient's wife he did have some difficulty finding words when he initially arrived but never had any slurred speech. She reports that he seemed to have some difficulty saying certain words yesterday as well that started approximate 4:30. The patient reports he has some mild headache and neck tightness but no other symptoms. The patient was told to come into the hospital if he ever had symptoms such as before so they came in for evaluation.   Past Medical History  Diagnosis Date  . Hypertension     Patient Active Problem List   Diagnosis Date Noted  . Hypertension 03/22/2015  . TIA (transient ischemic attack) 03/22/2015  . OSA (obstructive sleep apnea) 03/22/2015    Past Surgical History  Procedure Laterality Date  . No past surgeries      Current Outpatient Rx  Name  Route  Sig   Dispense  Refill  . amLODipine (NORVASC) 10 MG tablet   Oral   Take 10 mg by mouth daily.         Marland Kitchen aspirin EC 325 MG tablet   Oral   Take 1 tablet (325 mg total) by mouth daily.   30 tablet   0   . benazepril (LOTENSIN) 40 MG tablet   Oral   Take 40 mg by mouth daily.         . hydrALAZINE (APRESOLINE) 25 MG tablet   Oral   Take 25 mg by mouth 3 (three) times daily.         . hydrochlorothiazide (HYDRODIURIL) 25 MG tablet   Oral   Take 25 mg by mouth daily.         . Multiple Vitamins-Minerals (MULTIVITAMIN PO)   Oral   Take 1 tablet by mouth daily.         . naproxen sodium (ANAPROX) 220 MG tablet   Oral   Take 440 mg by mouth every morning.         . nebivolol (BYSTOLIC) 10 MG tablet   Oral   Take 10 mg by mouth daily.         . Omega-3 Fatty Acids (FISH OIL PO)   Oral   Take 2 capsules by mouth daily.         . rosuvastatin (CRESTOR) 20 MG tablet   Oral   Take 1 tablet (20  mg total) by mouth daily.   30 tablet   0     Allergies Review of patient's allergies indicates no known allergies.  Family History  Problem Relation Age of Onset  . Lupus Mother     Social History History  Substance Use Topics  . Smoking status: Former Smoker    Quit date: 03/21/1996  . Smokeless tobacco: Not on file  . Alcohol Use: No    Review of Systems Constitutional: No fever/chills Eyes: No visual changes. ENT: No sore throat. Cardiovascular: Denies chest pain. Respiratory: Denies shortness of breath. Gastrointestinal: No abdominal pain.  No nausea, no vomiting.  No diarrhea.  No constipation. Genitourinary: Negative for dysuria. Musculoskeletal: Negative for back pain. Skin: Negative for rash. Neurological: Headache, dizziness, presyncope  10-point ROS otherwise negative.  ____________________________________________   PHYSICAL EXAM:  VITAL SIGNS: ED Triage Vitals  Enc Vitals Group     BP 05/20/15 2223 144/90 mmHg     Pulse Rate  05/20/15 2223 74     Resp 05/20/15 2223 18     Temp 05/20/15 2223 98.9 F (37.2 C)     Temp Source 05/20/15 2223 Oral     SpO2 05/20/15 2223 98 %     Weight 05/20/15 2223 288 lb (130.636 kg)     Height 05/20/15 2223  (1.727 m)     Head Cir --      Peak Flow --      Pain Score 05/20/15 2224 0     Pain Loc --      Pain Edu? --      Excl. in GC? --     Constitutional: Alert and oriented. Well appearing and in no acute distress. Eyes: Conjunctivae are normal. PERRL. EOMI. Head: Atraumatic. Nose: No congestion/rhinnorhea. Mouth/Throat: Mucous membranes are moist.  Oropharynx non-erythematous. Cardiovascular: Normal rate, regular rhythm. Grossly normal heart sounds.  Good peripheral circulation. Respiratory: Normal respiratory effort.  No retractions. Lungs CTAB. Gastrointestinal: Soft and nontender. No distention. Positive bowel sounds Genitourinary: Deferred  Musculoskeletal: No lower extremity tenderness nor edema.  No joint effusions. Neurologic:  Normal speech and language. No gross focal neurologic deficits are appreciated. Cranial nerves II through XII are grossly intact the patient has no pronator drift the patient has no dysmetria on cerebellar exam Skin:  Skin is warm, dry and intact. No rash noted. Psychiatric: Mood and affect are normal.   ____________________________________________   LABS (all labs ordered are listed, but only abnormal results are displayed)  Labs Reviewed  CBC - Abnormal; Notable for the following:    RDW 15.0 (*)    All other components within normal limits  COMPREHENSIVE METABOLIC PANEL - Abnormal; Notable for the following:    Glucose, Bld 108 (*)    Calcium 8.8 (*)    All other components within normal limits  PROTIME-INR  APTT  DIFFERENTIAL  TROPONIN I   ____________________________________________  EKG  ED ECG REPORT I, Rebecka Apley, the attending physician, personally viewed and interpreted this ECG.   Date:  05/21/2015  EKG Time: 2231  Rate: 73  Rhythm: normal sinus rhythm  Axis: normal  Intervals:none  ST&T Change: flipped t waves lead III, J point elevation V2 similar to EKG from May 2016  ____________________________________________  RADIOLOGY  CT head: Negative CT head ____________________________________________   PROCEDURES  Procedure(s) performed: None  Critical Care performed: No  ____________________________________________   INITIAL IMPRESSION / ASSESSMENT AND PLAN / ED COURSE  Pertinent labs & imaging results that  were available during my care of the patient were reviewed by me and considered in my medical decision making (see chart for details).  This is a 54 year old male who comes in tonight with some presyncopal symptoms while at work. Since the patient was standing when this occurred we did do orthostatic vital signs which were negative. I still gave the patient a liter of normal saline with a concern that maybe he had some dehydration and was overheated which caused his symptoms. The patient's workup at this time is unremarkable. His CT of his head as well as his blood work is all unremarkable. The patient has been admitted for these symptoms in the past with a negative workup. I will discharge the patient to home to have follow-up with neurology for further evaluation and examination. I discussed this with the patient is wife and they do agree with this plan as stated. We did discuss the patient increasing his aspirin from 81 mg Tylenol dose for extra protection and the do agree with that plan as well. Otherwise patient has no further questions or concerns and he will be discharged to home to follow-up with his doctor as well as neurology. ____________________________________________   FINAL CLINICAL IMPRESSION(S) / ED DIAGNOSES  Final diagnoses:  Dizziness  Pre-syncope      Rebecka ApleyAllison P Teal Bontrager, MD 05/21/15 [phone]

## 2015-05-21 NOTE — ED Notes (Signed)
Patient is resting comfortably. WIFE AT BEDSIDE.

## 2015-05-21 NOTE — ED Notes (Signed)
Family at bedside. 

## 2015-06-22 ENCOUNTER — Encounter: Payer: Self-pay | Admitting: Physical Therapy

## 2015-06-22 ENCOUNTER — Ambulatory Visit: Payer: BC Managed Care – PPO | Attending: Neurology | Admitting: Physical Therapy

## 2015-06-22 DIAGNOSIS — R42 Dizziness and giddiness: Secondary | ICD-10-CM | POA: Diagnosis not present

## 2015-06-22 NOTE — Therapy (Signed)
[place] [facility] MAIN [facility] SERVICES [address] [place], [place], [address] Phone: [phone]   Fax:  [phone]  Physical Therapy Evaluation  Patient Details  Name: Shane Smith. MRN: [id number] Date of Birth: 1961-10-07 Referring Provider:  Morene Crocker, MD  Encounter Date: 06/22/2015      PT End of Session - 06/22/15 1417    Visit Number 1   Number of Visits 8   Date for PT Re-Evaluation 08/17/15   PT Start Time 1304   PT Stop Time 1400   PT Time Calculation (min) 56 min   Equipment Utilized During Treatment Gait belt   Activity Tolerance Patient tolerated treatment well   Behavior During Therapy [facility] for tasks assessed/performed      Past Medical History  Diagnosis Date  . Hypertension     Past Surgical History  Procedure Laterality Date  . No past surgeries      There were no vitals filed for this visit.  Visit Diagnosis:  Dizziness and giddiness   VESTIBULAR AND BALANCE EVALUATION  Onset Date: April 2016  HISTORY:  Subjective history of current problem: Pt reports that in April he sat up quickly and had an episode of vertigo and went to ER and was told it was possible inner ear infection. A few weeks later, patient reports he was at work and he became lightheaded and dizzy like he was going to pass out. Pt reports that he had the following tests performed during ER visit: MRI and MRA, echocardiogram, US carotid and patient reports that all tests were negative. Pt states two months later he had another episode of feeling like he was going to pass out. Pt states he has had the dizziness symptoms consistently since that initial episode. Pt states if he stops quickly he gets the sensation that he is still moving. Pt states he was worried he was dehydrated, but he has increased his water intake and has noted no changes with his symptoms. Pt states he was veering when he initially had this problem but now that is better.   Description of dizziness: vertigo, unsteadiness, lightheadedness, aural fullness "like water is running through my ears or an ear pain either side", rocking, imbalance, veering, dizzy  Symptom nature: motion provoked/ spontaneous/ variable Frequency: daily episodes Duration: minutes   Provocative Factors: reports turning left, hot temperatures, turning Easing Factors: sitting and staying still   Progression of symptoms: better History of similar episodes:_none  Auditory complaints (tinnitus, pain, drainage): denies tinnitus or drainage  Vision: (last eye exam, diplopia, recent changes)_denies_  Current Symptoms: denies dysarthria, dysphagia, drop attacks, bowel and bladder changes, recent weight loss/gain  Falls (yes/no): no_Pt expresses fear of falling. Number of falls in past 6 months:_none_  Prior Functional Level: Pt lives in a one story home with 2 steps with no rails to enter rear of home. Pt reports that he is working, driving and independent ADLs; pt reports that he recently started working out with home exercise equipment several times a week.   EXAMINATION:  NEUROLOGICAL SCREEN: (2+ unless otherwise noted.)  Level Dermatome R L  C3 Anterior Neck N N  C4 Top of Shoulder N N  C5 Lateral Upper Arm N N  C6 Lateral Arm/ Thumb N N  C7 Middle Finger N N  C8 4th & 5th Finger N N  T1 Medial Arm N N  L2 Medial thigh/groin N N  L3 Lower thigh/med.knee N N  L4 Medial leg/lat thigh N  N  L5 Lat. leg & dorsal foot N N  S1 post/lat foot/thigh/leg N N  S2 Post./med. thigh & leg N N   N=normal  Ab=abnormal   POSTURE:_ Pt with poor sitting posture with rounded shoulders, slumped sitting posture, forward head and sacral sitting.   SOMATOSENSORY:        Sensation           Intact      Diminished         Absent  Light touch Normal    Any N & T in extremities or weakness:denies  COORDINATION: Finger to Nose:  Normal, accurate left and right  Past Pointing:    Normal    MUSCULOSKELETAL SCREEN: Cervical Spine AROM: WFL L,R SB, L/R rotation, flexion and extension: left and right rotation limited by discomfort "stiff neck"    GAIT: Pt arrives to clinic ambulating without AD with fair cadence with no LOB or veering noted.  Scanning of visual environment with gait is: fair   BALANCE: Pt challenged by narrow BOS, uneven surfaces and EC activities.   POSTURAL CONTROL TESTS:  Rhomberg: EO on firm: 30 seconds    EC on firm: 30 seconds with +1 sway         EO on foam: 30 seconds with +1 sway  EC on foam: 30 seconds with +2 sway      OCULOMOTOR / VESTIBULAR TESTING:  Oculomotor Exam- Room Light  Normal Abnormal Comments  Ocular Alignment N    Ocular ROM N    Spontaneous Nystagmus N    End-Gaze Nystagmus  Nystagmus at end range   Smooth Pursuit  Abnormal Nystagmus noted  Saccades N    VOR N    VOR Cancellation  Abnormal Mild sensation of dizziness  Left Head Thrust N    Right Head Thrust   Pt reports increase in dizziness with head thrust to right but no nystagmus or gaze correction observed  Head Shaking Nystagmus N   No nystagmus noted and pt reports mild dizziness "a little"    BPPV TESTS:  Symptoms Duration Intensity Nystagmus  L Dix-Hallpike negative   negative  R Dix-Hallpike negative   negative  L Head Roll negative   negative  R Head Roll negative   negative   Functional Outcome Measures:  Results Comments  DHI Plan to do next session   ABC Scale Plan to do next session   DGI 24/24 normal            PT Education - 06/22/15 1417    Education provided Yes   Education Details upper traps stretch and VOR X 1 in sitting   Person(s) Educated Patient   Methods Explanation;Demonstration;Handout   Comprehension Verbalized understanding;Returned demonstration             PT Long Term Goals - 06/23/15 1137    PT LONG TERM GOAL #1   Title Patient will be able to perform home program independently for  self-management.   Time 8   Period Weeks   Status New   PT LONG TERM GOAL #2   Title Patient will have demonstrate decreased falls risk as indicated by Activities Specific Balance Confidence Scale score of 80% or greater.   Time 8   Period Weeks   Status New   PT LONG TERM GOAL #3   Title Patient reports no vertigo with provoking motions or positions.   Time 8   Period Weeks   Status New   PT  LONG TERM GOAL #4   Title Patient will reduce perceived disability to low levels as indicated by <40 on Dizziness Handicap Inventory.   Time 8   Period Weeks   Status New               Plan - 06/22/15 1420    Clinical Impression Statement Pt presents with listed functional deficits. Pt with negative canal testing this date. Pt demonstrates difficulty with EC activities and activities with body turns and head turns. Pt would benefit from PT services to address goals as set on POC, to try to decrease patient's subjective symptoms of dizziness, postural reeducation and to work on porgressions of vestibulo-ocular and high level balance activities.    Pt will benefit from skilled therapeutic intervention in order to improve on the following deficits Dizziness;Decreased range of motion;Postural dysfunction;Decreased balance   Rehab Potential Good   Clinical Impairments Affecting Rehab Potential positive indicators: motivated, family support  negative indicators: chronicity   PT Frequency 1x / week   PT Duration 8 weeks   PT Treatment/Interventions Canalith Repostioning;Patient/family education;Balance training;Neuromuscular re-education;Vestibular   PT Next Visit Plan Work on progressions of VOR X 1 exercise, vestibulo-ocular exercises and activities with quick turns   PT Home Exercise Plan VOR X 1 in sitting and upper traps stretch   Consulted and Agree with Plan of Care Patient         Problem List Patient Active Problem List   Diagnosis Date Noted  . Hypertension 03/22/2015  . TIA  (transient ischemic attack) 03/22/2015  . OSA (obstructive sleep apnea) 03/22/2015   Mardelle Matte PT, DPT Mardelle Matte 06/23/2015, 11:39 AM  [place] [facility] MAIN [facility] SERVICES [address] [place], [place], [address] Phone: [phone]   Fax:  [phone]

## 2015-06-23 NOTE — Addendum Note (Signed)
Addended by: Leanor Rubenstein on: 06/23/2015 11:48 AM   Modules accepted: Orders

## 2015-06-27 ENCOUNTER — Ambulatory Visit: Payer: BC Managed Care – PPO | Admitting: Physical Therapy

## 2015-06-27 ENCOUNTER — Encounter: Payer: Self-pay | Admitting: Physical Therapy

## 2015-06-27 DIAGNOSIS — R42 Dizziness and giddiness: Secondary | ICD-10-CM

## 2015-06-27 NOTE — Therapy (Signed)
[place] [facility] MAIN [facility] SERVICES [address] [place], [place], [address] Phone: [phone]   Fax:  [phone]  Physical Therapy Treatment  Patient Details  Name: Shane Smith. MRN: [id number] Date of Birth: Feb 09, 1961 Referring Provider:  Morene Crocker, MD  Encounter Date: 06/27/2015      PT End of Session - 06/27/15 1223    Visit Number 2   Number of Visits 8   Date for PT Re-Evaluation 08/17/15   PT Start Time 1139   PT Stop Time 1217   PT Time Calculation (min) 38 min   Equipment Utilized During Treatment Gait belt   Activity Tolerance Patient tolerated treatment well   Behavior During Therapy [facility] for tasks assessed/performed      Past Medical History  Diagnosis Date  . Hypertension     Past Surgical History  Procedure Laterality Date  . No past surgeries      There were no vitals filed for this visit.  Visit Diagnosis:  Dizziness and giddiness      Subjective Assessment - 06/27/15 1222    Subjective Pt states that he tried his exercises at home and that he was having dizziness while doing the exercises. Pt states that he is doing so-so today.       Neuromuscular Re-education: VOR exercise: In standing on firm surface, pt performed VOR X 1 horiz 3 reps of 1 minute. Demonstrating good technique. Pt reports 5/10 dizziness with this activity.    Airex pad: On firm surface and then on Airex pad, performed feet together with horiz and vert head turns and body turns, and then, semi-tandem progressions with alternate lead leg with and without head turns horiz and vert and body turns with CGA.   Quick Turns:  Performed 10 reps walking 8' with alternating quick turns L/R.   Newman Pies toss to self: Performed static ball toss to self horiz and vert while turning head and eyes to follow ball. Then, worked on 44' trials of forward and retro ambulation while doing ball toss to self vert and horiz with CGA. Pt reports 4-5/10  dizziness with this activity.     Walking head turns: Performed 69' trials each of forwards and retro ambulation with and without vert and horiz head turns with CGA.  Stretching: Pt performed L and R upper trap stretch with one vc for technique.       PT Education - 06/27/15 1223    Education provided Yes   Education Details reviewed upper traps stretch and VOR X 1 exercise and added additional exercises to HEP   Person(s) Educated Patient   Methods Explanation;Demonstration;Handout   Comprehension Verbalized understanding;Returned demonstration             PT Long Term Goals - 06/23/15 1137    PT LONG TERM GOAL #1   Title Patient will be able to perform home program independently for self-management.   Time 8   Period Weeks   Status New   PT LONG TERM GOAL #2   Title Patient will have demonstrate decreased falls risk as indicated by Activities Specific Balance Confidence Scale score of 80% or greater.   Time 8   Period Weeks   Status New   PT LONG TERM GOAL #3   Title Patient reports no vertigo with provoking motions or positions.   Time 8   Period Weeks   Status New   PT LONG TERM GOAL #4   Title Patient will reduce perceived  disability to low levels as indicated by <40 on Dizziness Handicap Inventory.   Time 8   Period Weeks   Status New               Plan - 06/27/15 1224    Clinical Impression Statement Pt did well overall with high level balance activties but reports dizziness with activties with head turns, body turns and quick turns. Progressed HEP. Pt was challenged by semi-tandem stance progressions on Airex pad with body turns and head turns activity this date. Continue PT services working on progressions of balance and vestibullo-ocular exercises.    Pt will benefit from skilled therapeutic intervention in order to improve on the following deficits Dizziness;Decreased range of motion;Postural dysfunction;Decreased balance   Rehab Potential Good    Clinical Impairments Affecting Rehab Potential positive indicators: motivated, family support  negative indicators: chronicity   PT Frequency 1x / week   PT Duration 8 weeks   PT Treatment/Interventions Canalith Repostioning;Patient/family education;Balance training;Neuromuscular re-education;Vestibular   PT Next Visit Plan Work on progressions of VOR X 1 exercise; review HEP; consider working on ambulation with ball toss over shoulder exercise and progression of Airex pad activities   PT Home Exercise Plan VOR X 1 in sitting and upper traps stretch   Consulted and Agree with Plan of Care Patient        Problem List Patient Active Problem List   Diagnosis Date Noted  . Hypertension 03/22/2015  . TIA (transient ischemic attack) 03/22/2015  . OSA (obstructive sleep apnea) 03/22/2015   Mardelle Matte PT, DPT Mardelle Matte 06/27/2015, 12:36 PM  [place] [facility] MAIN [facility] SERVICES [address] [place], [place], [address] Phone: [phone]   Fax:  [phone]

## 2015-07-03 ENCOUNTER — Ambulatory Visit: Payer: BC Managed Care – PPO

## 2015-07-03 ENCOUNTER — Encounter: Payer: Self-pay | Admitting: Physical Therapy

## 2015-07-03 VITALS — BP 153/79 | HR 63

## 2015-07-03 DIAGNOSIS — R42 Dizziness and giddiness: Secondary | ICD-10-CM | POA: Diagnosis not present

## 2015-07-03 NOTE — Therapy (Signed)
[facility] MAIN [facility] SERVICES [address] [place], [place], [address] Phone: [phone]   Fax:  [phone]  Physical Therapy Treatment  Patient Details  Name: Shane Smith. MRN: [id number] Date of Birth: 1960-12-09 Referring Provider:  Morene Crocker, MD  Encounter Date: 07/03/2015      PT End of Session - 07/04/15 1604    Visit Number 3   Number of Visits 8   Date for PT Re-Evaluation 08/17/15   PT Start Time 1345   PT Stop Time 1430   PT Time Calculation (min) 45 min   Equipment Utilized During Treatment Gait belt   Activity Tolerance Patient tolerated treatment well   Behavior During Therapy [facility] for tasks assessed/performed      Past Medical History  Diagnosis Date  . Hypertension     Past Surgical History  Procedure Laterality Date  . No past surgeries      Filed Vitals:   07/03/15 1357  BP: 153/79  Pulse: 63    Visit Diagnosis:  Dizziness and giddiness      Subjective Assessment - 07/03/15 1356    Subjective Pt reports he has been performing HEP and has no specific questions or concerns. Reports that he has felt a little "off" today. Pt reports difficulty performing VOR x 1 on pillow.    Currently in Pain? No/denies       Neuromuscular Re-education: VOR exercise: In standing on Airex, pt performed VOR X 1 horiz WBOS x 1 minute, NBOS x 1 min (4/10 dizziness), vertical x 1 min (2/10 dizziness). Demonstrates good technique but cues to increase range slightly.  Airex pad: On firm surface and then on Airex pad, performed feet together with horiz and vert head turns and body turns, and then, semi-tandem progressions with alternate lead leg with and without head turns horiz adding body turns with CGA.   Parallel bars: Standing marching with opposite overhead arm swings and horizontal head turns on firm surface (standing mountain climbers) 1 min x 2;  Walking head turns: Performed 78' trials each of  forwards and retro ambulation with and without vert and horiz head turns with CGA. Quick turns performed with patient on command;  Body rolls: Performed body rolls near wall x 5 in each direction with rest break between repetitions (2/10 dizziness);  Newman Pies toss to self: Multiple 80' trials of forward and retro ambulation while doing ball toss to self vert and horiz with CGA. Pt reports 2/10 dizziness with this activity.Performed retro ambulation with ball pass over shoulders (4/10 dizziness);  Card Sorting: Performed card sorting standing on red mat and performing 180 degree turns while focusing on card. Pt reports 4/10 dizziness;                          PT Education - 07/04/15 1603    Education provided Yes   Education Details HEP advanced: standing marching with alternating arm swings and horizontal head turns on firm surface (standing mountain climbers)   Person(s) Educated Patient   Methods Explanation;Demonstration   Comprehension Verbalized understanding;Returned demonstration             PT Long Term Goals - 06/23/15 1137    PT LONG TERM GOAL #1   Title Patient will be able to perform home program independently for self-management.   Time 8   Period Weeks   Status New   PT LONG TERM GOAL #2   Title Patient  will have demonstrate decreased falls risk as indicated by Activities Specific Balance Confidence Scale score of 80% or greater.   Time 8   Period Weeks   Status New   PT LONG TERM GOAL #3   Title Patient reports no vertigo with provoking motions or positions.   Time 8   Period Weeks   Status New   PT LONG TERM GOAL #4   Title Patient will reduce perceived disability to low levels as indicated by <40 on Dizziness Handicap Inventory.   Time 8   Period Weeks   Status New               Plan - 07/03/15 1357    Clinical Impression Statement Pt does well with all activities today without overt LOB or severe dizziness. Most aggravating  activities produce 4/10 dizzines and include retro ambulation with ball pass over shoulder and card sorting with 180 degree turns on red floor mat. Added standing mountain climbers with horizontal head turns on firm surface to HEP. Pt encouraged to continue additional HEP and follow-up as scheduled.    Pt will benefit from skilled therapeutic intervention in order to improve on the following deficits Dizziness;Decreased range of motion;Postural dysfunction;Decreased balance   Rehab Potential Good   Clinical Impairments Affecting Rehab Potential positive indicators: motivated, family support  negative indicators: chronicity   PT Frequency 1x / week   PT Duration 8 weeks   PT Treatment/Interventions Canalith Repostioning;Patient/family education;Balance training;Neuromuscular re-education;Vestibular   PT Next Visit Plan Work on progressions of VOR X 1 exercise to forward/retro ambulation; review HEP and consider progressions. Continue quick turning card sorting or ball passing as well as retro ambulation with ball passes   PT Home Exercise Plan VOR X 1 in sitting, upper traps stretch, standing mountain climbers with horizontal head turns.    Consulted and Agree with Plan of Care Patient        Problem List Patient Active Problem List   Diagnosis Date Noted  . Hypertension 03/22/2015  . TIA (transient ischemic attack) 03/22/2015  . OSA (obstructive sleep apnea) 03/22/2015   Shane Smith PT, DPT   Shane Smith 07/04/2015, 4:16 PM  [facility] MAIN [facility] SERVICES [address] [place], [place], [address] Phone: [phone]   Fax:  [phone]

## 2015-07-11 ENCOUNTER — Ambulatory Visit: Payer: BC Managed Care – PPO | Admitting: Physical Therapy

## 2015-07-11 DIAGNOSIS — R42 Dizziness and giddiness: Secondary | ICD-10-CM

## 2015-07-12 ENCOUNTER — Encounter: Payer: Self-pay | Admitting: Physical Therapy

## 2015-07-12 NOTE — Therapy (Signed)
[facility] MAIN [facility] SERVICES [address] [place], [place], [address] Phone: [phone]   Fax:  [phone]  Physical Therapy Treatment  Patient Details  Name: Shane Smith. MRN: [id number] Date of Birth: October 09, 1961 Referring Giavonni Fonder:  Morene Crocker, MD  Encounter Date: 07/11/2015      PT End of Session - 07/12/15 1523    Visit Number 4   Number of Visits 8   Date for PT Re-Evaluation 08/17/15   PT Start Time 1140   PT Stop Time 1220   PT Time Calculation (min) 40 min   Equipment Utilized During Treatment Gait belt   Activity Tolerance Patient tolerated treatment well   Behavior During Therapy [facility] for tasks assessed/performed      Past Medical History  Diagnosis Date  . Hypertension     Past Surgical History  Procedure Laterality Date  . No past surgeries      There were no vitals filed for this visit.  Visit Diagnosis:  Dizziness and giddiness      Subjective Assessment - 07/12/15 1519    Subjective Pt reports that he has been doing "okay" and states he has not noticed much change. Pt stated that he did notice that he was not feeling as much diziness when he was doing activities such as work.       Neuromuscular Re-education: VOR exercise: On firm surface but with conflicting background performed 3 reps of 1 minute each. Pt reports increased dizziness with this exercise and rates it 5/10.   Newman Pies toss over shoulder: Pt performed multiple 175' trials of forward and retro ambulation while tossing ball over one shoulder with return catch over opposite shoulder at shoulder level and then with varying the ball position to head, shoulder and waist level to promote head turning and tilting.  Ball passing around self:  In standing, pt performed ball passing around patient's trunk while tracking ball with eyes and head as ball passed across waist.  Ball Dribbling Activity:  Pt performed basketball dribbling from  side to side while ambulating while tracking ball with eyes and head. Repeated activity while navigating through cone obstacle course.        PT Education - 07/12/15 1520    Education provided Yes   Education Details discussed adding ball dribbling exercise and passing ball around body while tracking ball with eyes and head. Progressed VOR X 1 exercise to doing conflicting background.    Person(s) Educated Patient   Methods Explanation;Demonstration;Handout   Comprehension Verbalized understanding;Returned demonstration             PT Long Term Goals - 06/23/15 1137    PT LONG TERM GOAL #1   Title Patient will be able to perform home program independently for self-management.   Time 8   Period Weeks   Status New   PT LONG TERM GOAL #2   Title Patient will have demonstrate decreased falls risk as indicated by Activities Specific Balance Confidence Scale score of 80% or greater.   Time 8   Period Weeks   Status New   PT LONG TERM GOAL #3   Title Patient reports no vertigo with provoking motions or positions.   Time 8   Period Weeks   Status New   PT LONG TERM GOAL #4   Title Patient will reduce perceived disability to low levels as indicated by <40 on Dizziness Handicap Inventory.   Time 8   Period Weeks   Status  New               Plan - 07/12/15 1525    Clinical Impression Statement Pt demonstrates good balance with high level balance activities this date. Pt does report increased dizziness with ball toss over shoulder exercise and ball dribbling and VOR X 1 with conflicting background. Continue PT services working towards goals as set on POC.    Pt will benefit from skilled therapeutic intervention in order to improve on the following deficits Dizziness;Decreased range of motion;Postural dysfunction;Decreased balance   Rehab Potential Good   Clinical Impairments Affecting Rehab Potential positive indicators: motivated, family support  negative indicators:  chronicity   PT Frequency 1x / week   PT Duration 8 weeks   PT Treatment/Interventions Canalith Repostioning;Patient/family education;Balance training;Neuromuscular re-education;Vestibular   PT Next Visit Plan Review added exercises and consider trying VOR on conflicting background while ambulating forward.    PT Home Exercise Plan VOR X 1 in sitting, upper traps stretch, standing mountain climbers with horizontal head turns.    Consulted and Agree with Plan of Care Patient        Problem List Patient Active Problem List   Diagnosis Date Noted  . Hypertension 03/22/2015  . TIA (transient ischemic attack) 03/22/2015  . OSA (obstructive sleep apnea) 03/22/2015   Mardelle Matte PT, DPT Mardelle Matte 07/12/2015, 3:36 PM   [facility] MAIN [facility] SERVICES [address] [place], [place], [address] Phone: [phone]   Fax:  [phone]

## 2015-07-24 ENCOUNTER — Encounter: Payer: Self-pay | Admitting: Physical Therapy

## 2015-07-24 ENCOUNTER — Ambulatory Visit: Payer: BC Managed Care – PPO | Attending: Neurology

## 2015-07-24 DIAGNOSIS — R42 Dizziness and giddiness: Secondary | ICD-10-CM | POA: Diagnosis present

## 2015-07-24 NOTE — Therapy (Signed)
[facility] MAIN [facility] SERVICES [address] [place], [place], [address] Phone: [phone]   Fax:  [phone]  Physical Therapy Treatment  Patient Details  Name: Shane Smith. MRN: [id number] Date of Birth: [birth year] Referring Provider:  Morene Crocker, MD  Encounter Date: 07/24/2015      PT End of Session - 07/24/15 1057    Visit Number 5   Number of Visits 8   Date for PT Re-Evaluation 08/17/15   PT Start Time 1059   PT Stop Time 1130   PT Time Calculation (min) 31 min   Equipment Utilized During Treatment Gait belt   Activity Tolerance Patient tolerated treatment well   Behavior During Therapy [facility] for tasks assessed/performed      Past Medical History  Diagnosis Date  . Hypertension     Past Surgical History  Procedure Laterality Date  . No past surgeries      There were no vitals filed for this visit.  Visit Diagnosis:  Dizziness and giddiness      Subjective Assessment - 07/24/15 1056    Subjective Pt states that he has been doing well. He is performing dribbling exercise at home without issue and states that it is "easy" for him. He states that he still has some "off" days but overall believes that he is improving. Pt reports that he is not performing standing marching exercise with head turns at home. No specific questions or concerns at this time.    Currently in Pain? No/denies        Neuromuscular Re-education: VOR exercise: VOR x 1 with forward and retro ambulation 35' x 3 (pt denies dizziness); VOR x 1 hoirzontal NBOS on Airex 60 seconds x 2 (5/10 dizziness);  Airex Slow marching x 60 seconds, slow marching with horizontal head turns x 60 seconds; slow marching with horizontal head turns and arm swings x 60 seconds; Balance beam side stepping followed by side stepping with horizontal head turns;  Ball toss over shoulder: Pt performed multiple bouts of retro ambulation with ball pass over one  shoulder with return catch over opposite shoulder at shoulder level and then with varying the ball position to head and shoulders. Forward ambulation with vertical ball toss;  180 ball toss Performed ball toss off wall while standing on Airex WBOS followed by NBOS with quick 180 degree turns 60 sec x 2; Ball toss off wall with quick 180 degree head turn during ambulation;  Ball Dribbling Activity:  Pt performed basketball dribbling from side to side while ambulating while tracking ball with eyes and head.                         PT Education - 07/24/15 1057    Education provided Yes   Education Details HEP reinforced with patient, progressed standing VOR x 1 to include foam   Person(s) Educated Patient   Methods Explanation   Comprehension Verbalized understanding             PT Long Term Goals - 06/23/15 1137    PT LONG TERM GOAL #1   Title Patient will be able to perform home program independently for self-management.   Time 8   Period Weeks   Status New   PT LONG TERM GOAL #2   Title Patient will have demonstrate decreased falls risk as indicated by Activities Specific Balance Confidence Scale score of 80% or greater.   Time 8  Period Weeks   Status New   PT LONG TERM GOAL #3   Title Patient reports no vertigo with provoking motions or positions.   Time 8   Period Weeks   Status New   PT LONG TERM GOAL #4   Title Patient will reduce perceived disability to low levels as indicated by <40 on Dizziness Handicap Inventory.   Time 8   Period Weeks   Status New               Plan - 07/24/15 1057    Clinical Impression Statement Pt unfortunately arrived late for his appointment so it was shortened accordingly due to scheduling constraints. Pt reports most dizziness with retro ambulation ball passess as well as quick 180 degree ball tosses on Airex. Encouraged pt to start standing marching with horizontal head turns and arms swings again as well  as progress standing VOR x 1 to NBOS on foam. Pt encouraged to follow-up as scheduled.    Pt will benefit from skilled therapeutic intervention in order to improve on the following deficits Dizziness;Decreased range of motion;Postural dysfunction;Decreased balance   Rehab Potential Good   Clinical Impairments Affecting Rehab Potential positive indicators: motivated, family support  negative indicators: chronicity   PT Frequency 1x / week   PT Duration 8 weeks   PT Treatment/Interventions Canalith Repostioning;Patient/family education;Balance training;Neuromuscular re-education;Vestibular   PT Next Visit Plan Progress quick 180 degree turns incorporating compliant surface.    PT Home Exercise Plan VOR X 1 in sitting, upper traps stretch, standing mountain climbers with horizontal head turns, ball dribbling during ambulation from side to side.    Consulted and Agree with Plan of Care Patient        Problem List Patient Active Problem List   Diagnosis Date Noted  . Hypertension 03/22/2015  . TIA (transient ischemic attack) 03/22/2015  . OSA (obstructive sleep apnea) 03/22/2015    Lynnea Maizes PT, DPT   Terez Montee 07/24/2015, 1:50 PM  [place] [facility] MAIN [facility] SERVICES [address] [place], [place], [address] Phone: [phone]   Fax:  [phone]

## 2015-07-31 ENCOUNTER — Encounter: Payer: Self-pay | Admitting: Physical Therapy

## 2015-07-31 ENCOUNTER — Ambulatory Visit: Payer: BC Managed Care – PPO

## 2015-07-31 DIAGNOSIS — R42 Dizziness and giddiness: Secondary | ICD-10-CM

## 2015-07-31 NOTE — Therapy (Signed)
[facility] MAIN [facility] SERVICES [address] [place], [place], [address] Phone: [phone]   Fax:  [phone]  Physical Therapy Treatment  Patient Details  Name: Shane Smith. MRN: [id number] Date of Birth: 27-Apr-1961 Referring Provider:  Morene Crocker, MD  Encounter Date: 07/31/2015      PT End of Session - 07/31/15 1020    Visit Number 6   Number of Visits 8   Date for PT Re-Evaluation 08/17/15   PT Start Time 1020   PT Stop Time 1045   PT Time Calculation (min) 25 min   Equipment Utilized During Treatment Gait belt   Activity Tolerance Patient tolerated treatment well   Behavior During Therapy [facility] for tasks assessed/performed      Past Medical History  Diagnosis Date  . Hypertension     Past Surgical History  Procedure Laterality Date  . No past surgeries      There were no vitals filed for this visit.  Visit Diagnosis:  Dizziness and giddiness      Subjective Assessment - 07/31/15 1019    Subjective Pt reports that he had a rough day yesterday. He states that his ears were bothering him again. He states that he was having an earache on the L side and it felt like "water was running between my ears."  Pt reports that overall he has improved approximatley 70% since starting therapy. He reports that the most difficult exercise is VOR x 1 horizontal on conflicting background as this brings on his symptoms. Other exercises do not provoke his feelings of dizziness.   Currently in Pain? No/denies  Pt reports "tightness" in neck       Neuromuscular Re-education: VOR exercise: VOR x 1 horizontal NBOS on Airex 60 seconds x 3 (5/10 dizziness); VOR x 1 with forward and retro ambulation 40' x 3, pt initially denies dizziness. Corrected form to increase range of head turns and slow down and pt reports considerable increase in dizziness (5/10 dizziness);   Ball toss over shoulder: Pt performed multiple bouts of retro  ambulation with ball pass over one shoulder with return catch over opposite shoulder at shoulder level and then with varying the ball position to head and shoulders.   Ball Dribbling Activity:  Pt performed basketball dribbling from side to side during retro ambulation while tracking ball with eyes and head.                          PT Education - 07/31/15 1020    Education provided Yes   Education Details HEP reinforced and progressed   Person(s) Educated Patient   Methods Explanation   Comprehension Verbalized understanding             PT Long Term Goals - 06/23/15 1137    PT LONG TERM GOAL #1   Title Patient will be able to perform home program independently for self-management.   Time 8   Period Weeks   Status New   PT LONG TERM GOAL #2   Title Patient will have demonstrate decreased falls risk as indicated by Activities Specific Balance Confidence Scale score of 80% or greater.   Time 8   Period Weeks   Status New   PT LONG TERM GOAL #3   Title Patient reports no vertigo with provoking motions or positions.   Time 8   Period Weeks   Status New   PT LONG TERM GOAL #4  Title Patient will reduce perceived disability to low levels as indicated by <40 on Dizziness Handicap Inventory.   Time 8   Period Weeks   Status New               Plan - 07/31/15 1023    Clinical Impression Statement Pt arrived late for appointment so session shortened accordingly. Patient's symptoms are more easily reproduced during today's session. He reports 5/10 dizziness at worst with standing VOR conflicting background as well as VOR with forward/retro ambulation. Pt provided cues for technique with respect to speed and neck ROM which make his symptoms more severe. Pt encouraged to progress HEP to VOR x 1 with ambulation and ball dribbling with retro ambulation   Pt will benefit from skilled therapeutic intervention in order to improve on the following deficits  Dizziness;Decreased range of motion;Postural dysfunction;Decreased balance   Rehab Potential Good   Clinical Impairments Affecting Rehab Potential positive indicators: motivated, family support  negative indicators: chronicity   PT Frequency 1x / week   PT Duration 8 weeks   PT Treatment/Interventions Canalith Repostioning;Patient/family education;Balance training;Neuromuscular re-education;Vestibular   PT Next Visit Plan Continue VOR x 1 horizontal and progress to forward/retro ambulation at home, ball dribbling with retro ambulation   PT Home Exercise Plan VOR X 1 with forward/retro ambulation as well as with conflicting background, upper traps stretch, standing mountain climbers with horizontal head turns, ball dribbling during retro ambulation   Consulted and Agree with Plan of Care Patient        Problem List Patient Active Problem List   Diagnosis Date Noted  . Hypertension 03/22/2015  . TIA (transient ischemic attack) 03/22/2015  . OSA (obstructive sleep apnea) 03/22/2015   Lynnea Maizes PT, DPT   Huprich,Jason 07/31/2015, 12:57 PM  [place] [facility] MAIN [facility] SERVICES [address] [place], [place], [address] Phone: [phone]   Fax:  [phone]

## 2015-08-07 ENCOUNTER — Ambulatory Visit: Payer: BC Managed Care – PPO | Admitting: Physical Therapy

## 2015-08-10 ENCOUNTER — Ambulatory Visit: Payer: BC Managed Care – PPO

## 2015-08-10 ENCOUNTER — Encounter: Payer: Self-pay | Admitting: Physical Therapy

## 2015-08-10 VITALS — BP 144/88 | HR 60

## 2015-08-10 DIAGNOSIS — R42 Dizziness and giddiness: Secondary | ICD-10-CM

## 2015-08-11 NOTE — Therapy (Signed)
[facility] MAIN [facility] SERVICES [address] [place], [place], [address] Phone: [phone]   Fax:  [phone]  Physical Therapy Treatment  Patient Details  Name: Shane Smith. MRN: [id number] Date of Birth: 1960-12-04 Referring Provider:  Morene Crocker, MD  Encounter Date: 08/10/2015      PT End of Session - 08/10/15 1022    Visit Number 7   Number of Visits 8   Date for PT Re-Evaluation 08/17/15   PT Start Time 1015   PT Stop Time 1055   PT Time Calculation (min) 40 min   Equipment Utilized During Treatment Gait belt   Activity Tolerance Patient tolerated treatment well   Behavior During Therapy [facility] for tasks assessed/performed      Past Medical History  Diagnosis Date  . Hypertension     Past Surgical History  Procedure Laterality Date  . No past surgeries      Filed Vitals:   08/10/15 1021  BP: 144/88  Pulse: 60    Visit Diagnosis:  Dizziness and giddiness      Subjective Assessment - 08/10/15 1016    Subjective Pt states he is about 40% improved since starting therapy. He reports that he will go for weeks without symptoms and then will have a bad day. Pt reports that yesterday he had a bad day. Unable to recall any changes in activity or aggravating factors which precipitated the worsening of symptoms. Pt states that his ears have been bothering him lately L>R and he is ready to go to ENT to get it checked out.    Currently in Pain? No/denies        Neuromuscular Re-education: VOR exercise: VOR x 1 horizontal NBOS on Airex 60 seconds x 2 (4/10 dizziness, decreases second bout); VOR x 1 horizontal with forward and retro ambulation 80' x 2, (4/10), one bout of VOR x 1 vertical x 80' (1-2/10).  Ball toss over shoulder: Pt performed multiple bouts of retro ambulation in hallway with ball pass over one shoulder with return catch over opposite shoulder at shoulder level and then with varying the ball position  to head and shoulders. (8/10 dizziness)  Ball Dribbling Activity:  Pt performed basketball dribbling from side to side during retro ambulation while tracking ball with eyes and head in hallway  Airex NBOS in Lifestyle center door frame tossing R and L with head/eye follow 60 seconds (2/10); low to high 60 seconds x 2 alternating sides (3/10);  Body Rolls Body rolls near wall x 5 each direction with 5 second break between each rep, pt denies any increase in dizziness;   Head to knee/celing Pt reports increase in symptoms if he bends forward and then looks up to the ceiling. Performed head to knee and then head to ceiling 2 x 10 to each side (5-6/10 on L, 3/10 on R); 1 min rest break between repetitions;                          PT Education - 08/10/15 1022    Education provided Yes   Education Details HEP reinforced and progressed   Person(s) Educated Patient   Methods Explanation   Comprehension Verbalized understanding             PT Long Term Goals - 06/23/15 1137    PT LONG TERM GOAL #1   Title Patient will be able to perform home program independently for self-management.  Time 8   Period Weeks   Status New   PT LONG TERM GOAL #2   Title Patient will have demonstrate decreased falls risk as indicated by Activities Specific Balance Confidence Scale score of 80% or greater.   Time 8   Period Weeks   Status New   PT LONG TERM GOAL #3   Title Patient reports no vertigo with provoking motions or positions.   Time 8   Period Weeks   Status New   PT LONG TERM GOAL #4   Title Patient will reduce perceived disability to low levels as indicated by <40 on Dizziness Handicap Inventory.   Time 8   Period Weeks   Status New               Plan - 08/10/15 1032    Clinical Impression Statement Pt demonstrates increased dizziness on this date with retro ambulation and ball passes over shoulder. Pt complaining of aural fullness and reports desire to  see ENT. Encouraged pt to see ENT and make appointment for dizziness with aural fullness. Pt provided progression of HEP today. Pt instructed to follow-up as scheduled. Will repeat outcome measures and recertify pt at next visit due to residual deficits and continued symptoms.    Pt will benefit from skilled therapeutic intervention in order to improve on the following deficits Dizziness;Decreased range of motion;Postural dysfunction;Decreased balance   Rehab Potential Good   Clinical Impairments Affecting Rehab Potential positive indicators: motivated, family support  negative indicators: chronicity   PT Frequency 1x / week   PT Duration 8 weeks   PT Treatment/Interventions Canalith Repostioning;Patient/family education;Balance training;Neuromuscular re-education;Vestibular   PT Next Visit Plan Repeat outcome meausres, update goals; retro ambulation with ball passes, head to knee/ceiling   PT Home Exercise Plan VOR X 1 with forward/retro ambulation as well as with conflicting background, upper traps stretch, standing mountain climbers with horizontal head turns, ball dribbling during retro ambulation, seated head to knee/ceiling 2 x 10 each side 2x/day   Consulted and Agree with Plan of Care Patient        Problem List Patient Active Problem List   Diagnosis Date Noted  . Hypertension 03/22/2015  . TIA (transient ischemic attack) 03/22/2015  . OSA (obstructive sleep apnea) 03/22/2015   Lynnea Maizes PT, DPT   Huprich,Jason 08/11/2015, 8:18 AM  [facility] MAIN [facility] SERVICES [address] [place], [place], [address] Phone: [phone]   Fax:  [phone]

## 2015-08-14 ENCOUNTER — Encounter: Payer: Self-pay | Admitting: Physical Therapy

## 2015-08-14 ENCOUNTER — Ambulatory Visit: Payer: BC Managed Care – PPO | Attending: Neurology

## 2015-08-14 VITALS — BP 156/91 | HR 58

## 2015-08-14 DIAGNOSIS — R42 Dizziness and giddiness: Secondary | ICD-10-CM | POA: Insufficient documentation

## 2015-08-14 NOTE — Therapy (Signed)
[facility] MAIN [facility] SERVICES [address] [place], [place], [address] Phone: [phone]   Fax:  [phone]  Physical Therapy Treatment/Recertification  Patient Details  Name: Shane Smith. MRN: [id number] Date of Birth: 05-Apr-1961 Referring Provider:  Morene Crocker, MD  Encounter Date: 08/14/2015      PT End of Session - 08/14/15 0928    Visit Number 8   Number of Visits 14   Date for PT Re-Evaluation 09/15/15   Authorization Type no go codes   PT Start Time 0925   PT Stop Time 1010   PT Time Calculation (min) 45 min   Equipment Utilized During Treatment Gait belt   Activity Tolerance Patient tolerated treatment well   Behavior During Therapy [facility] for tasks assessed/performed      Past Medical History  Diagnosis Date  . Hypertension     Past Surgical History  Procedure Laterality Date  . No past surgeries      Filed Vitals:   08/14/15 0939  BP: 156/91  Pulse: 58    Visit Diagnosis:  Dizziness and giddiness - Plan: PT plan of care cert/re-cert      Subjective Assessment - 08/14/15 0925    Subjective Pt states that on Saturday he had a lot of "lightheadedness" but no true dizziness. He was at work and was very busy with a lot running around. Otherwise he only had mild dizziness yesterday but resolved fairly quickly. Pt performed new exercise as part of HEP with some dizziness reported. No specific questions or concerns.    Currently in Pain? No/denies            [facility] PT Assessment - 08/14/15 0930    Observation/Other Assessments   Other Surveys  Other Surveys   Activities of Balance Confidence Scale (ABC Scale)  88.75%   Dizziness Handicap Inventory [facility])  16/100        Neuromuscular Re-education:  Pt completed DHI and ABC. Discussed results and updated goals with patient. Reviewed plan of care moving forward.   Ball tosses: Performed forward/retro ambulation with vertical and horizontal head  turns. Pt performed multiple bouts of retro ambulation in hallway with ball pass over one shoulder with return catch over opposite shoulder at shoulder level and then with varying the ball position to head and shoulders. (8/10 dizziness)  Ball Dribbling Activity:   Pt performed basketball dribbling from side to side during retro ambulation while tracking ball with eyes and head in hallway.  Airex NBOS eyes closed x 30 seconds; Marching with alternating arm swings with horizontal head turns x 60 seconds. Airex balance beam tandem gait with horizontal head turns x 4 cycles; Airex balance beam lateral stepping with horizontal head turns.                      PT Education - 08/14/15 [phone]    Education provided Yes   Education Details Discussed plan of care moving forward   Person(s) Educated Patient   Methods Explanation   Comprehension Verbalized understanding             PT Long Term Goals - 08/14/15 0933    PT LONG TERM GOAL #1   Title Patient will be able to perform home program independently for self-management.   Time 8   Period Weeks   Status Achieved   PT LONG TERM GOAL #2   Title Patient will have demonstrate decreased falls risk as indicated by Activities Specific  Balance Confidence Scale score of 80% or greater.   Baseline 08/14/15: 88.75%   Time 8   Period Weeks   Status Achieved   PT LONG TERM GOAL #3   Title Patient reports worst dizziness of 5/10 with provoking motions or positions by 09/15/15   Baseline 08/14/15: every other day, worst: 8/10, best: 0/10   Time 8   Period Weeks   Status Revised   PT LONG TERM GOAL #4   Title Patient will reduce perceived disability to low levels as indicated by <40 on Dizziness Handicap Inventory.   Baseline 08/14/15: 16/100   Time 8   Period Weeks   Status Achieved               Plan - 08/14/15 0929    Clinical Impression Statement Pt reports he is approximately 40% improved since starting therapy. He  continues to reports symptoms approximately every other day with worst dizziness reported as 8/10. Pt also reports intermittent feelings of lightheadedness at work. He reports excellent balance confidence and overall perception of disability is low. However pt continues to have symptoms and states he plans to call for ENT appointment. Therapist agrees that pt would benefit from additional testing such as VNG to further investigate patient's symptoms. Pt would benefit from continued skilled vestibular therapy to address deficits and continue making progress toward his goal of symptom resolution and return to full function at home and work.    Pt will benefit from skilled therapeutic intervention in order to improve on the following deficits Dizziness;Decreased range of motion;Postural dysfunction;Decreased balance   Rehab Potential Good   Clinical Impairments Affecting Rehab Potential positive indicators: motivated, family support  negative indicators: chronicity   PT Frequency 1x / week   PT Duration 4 weeks   PT Treatment/Interventions Canalith Repostioning;Patient/family education;Balance training;Neuromuscular re-education;Vestibular   PT Next Visit Plan Continue habituation exercises, progress high level balance. Pt to schedule ENT appointment   PT Home Exercise Plan VOR X 1 with forward/retro ambulation as well as with conflicting background, upper traps stretch, standing mountain climbers with horizontal head turns, ball dribbling during retro ambulation, seated head to knee/ceiling 2 x 10 each side 2x/day   Consulted and Agree with Plan of Care Patient        Problem List Patient Active Problem List   Diagnosis Date Noted  . Hypertension 03/22/2015  . TIA (transient ischemic attack) 03/22/2015  . OSA (obstructive sleep apnea) 03/22/2015   Lynnea Maizes PT, DPT   Huprich,Jason 08/14/2015, 4:36 PM  [facility] MAIN [facility] SERVICES [address]  [place], [place], [address] Phone: [phone]   Fax:  [phone]

## 2015-08-21 ENCOUNTER — Encounter: Payer: Self-pay | Admitting: Physical Therapy

## 2015-08-21 ENCOUNTER — Ambulatory Visit: Payer: BC Managed Care – PPO

## 2015-08-21 DIAGNOSIS — R42 Dizziness and giddiness: Secondary | ICD-10-CM

## 2015-08-21 NOTE — Patient Instructions (Addendum)
Neuromuscular Re-education:

## 2015-08-21 NOTE — Therapy (Signed)
[facility] MAIN [facility] SERVICES [address] [place], [place], [address] Phone: [phone]   Fax:  [phone]  Physical Therapy Treatment  Patient Details  Name: Shane Smith. MRN: [id number] Date of Birth: [birth year] Referring Provider:  Morene Crocker, MD  Encounter Date: 08/21/2015      PT End of Session - 08/21/15 1007    Visit Number 9   Number of Visits 14   Date for PT Re-Evaluation 09/15/15   Authorization Type no go codes   PT Start Time 1008   PT Stop Time 1050   PT Time Calculation (min) 42 min   Equipment Utilized During Treatment Gait belt   Activity Tolerance Patient tolerated treatment well   Behavior During Therapy [facility] for tasks assessed/performed      Past Medical History  Diagnosis Date  . Hypertension     Past Surgical History  Procedure Laterality Date  . No past surgeries      There were no vitals filed for this visit.  Visit Diagnosis:  Dizziness and giddiness      Subjective Assessment - 08/21/15 1006    Subjective Pt reports feeling well today. States he really hasn't had much dizziness since last therapy session. Pt does report one day where he had mild dizziness but overall feels much improved over the last week. Performing HEP without issue. No specific questions or concerns currently.   Currently in Pain? No/denies       Neuromuscular Re-education  Airex NBOS eyes closed x 60 seconds, horizontal head turns x 60 seconds, horizontal head turns with body turns x 60 seconds; Semi-tandem eyes closed x 60 seconds, horizontal head turns x 60 seconds, horizontal head turns with body turns x 60 seconds; Tandem eyes open x 60 seconds followed by eyes closed x 60 seconds, tandem eyes open horizontal head turns x 60 seconds; Airex balance beam tandem gait with horizontal head turns x 4 laps; Airex balance beam lateral stepping with horizontal head turns x 4 laps; Ball tosses in doorway of lifestyle  center alternating low to high 30 seconds x 2 (2/10); Marching with arm swings and head turns x 60 seconds;  Quick Turns Performed ambulation with patient incorporating quick turns on command in both directions 80' x 2; No reported dizziness  Ball tosses: Pt performed multiple bouts of forward and retro ambulation in hallway with lateral and vertical ball toss; Ball pass over one shoulder with return catch over opposite shoulder at shoulder level and then with varying the ball position to head and shoulders. (4/10 dizziness).   Ball Dribbling Activity:   Pt performed basketball dribbling from side to side during forward and retro ambulation while tracking ball with eyes and head in hallway (3/10);  Knee to Ceiling: Knee to ceiling 2 x 10 bilateral (3/10), worse on the R compared to L Performed knee to ceiling both on L and R side 2 x 10;                          PT Education - 08/21/15 1007    Education provided Yes   Education Details HEP reinforced, perform daily. Contact neurology to attempt referral for appointment to ENT since no success with direct phone calls by patient   Person(s) Educated Patient   Methods Explanation   Comprehension Verbalized understanding             PT Long Term Goals - 08/14/15 6962  PT LONG TERM GOAL #1   Title Patient will be able to perform home program independently for self-management.   Time 8   Period Weeks   Status Achieved   PT LONG TERM GOAL #2   Title Patient will have demonstrate decreased falls risk as indicated by Activities Specific Balance Confidence Scale score of 80% or greater.   Baseline 08/14/15: 88.75%   Time 8   Period Weeks   Status Achieved   PT LONG TERM GOAL #3   Title Patient reports worst dizziness of 5/10 with provoking motions or positions by 09/15/15   Baseline 08/14/15: every other day, worst: 8/10, best: 0/10   Time 8   Period Weeks   Status Revised   PT LONG TERM GOAL #4   Title  Patient will reduce perceived disability to low levels as indicated by <40 on Dizziness Handicap Inventory.   Baseline 08/14/15: 16/100   Time 8   Period Weeks   Status Achieved               Plan - 08/21/15 1007    Clinical Impression Statement Pt with inconsistent reports of dizziness between sessions due to fluctuation of symptoms. Pt has not received return calls from [facility] despite repeated attempts to schedule an appointment. Encouraged pt to contact neurology to request referral as this might help speed along the process. Otherwise pt could look for ENT in [place]. Pt encouraged to perform HEP daily and reinforced exercises. Pt demonstrates minimal reports of dizziness during session today. Pt encouraged to follow-up as scheduled.    Pt will benefit from skilled therapeutic intervention in order to improve on the following deficits Dizziness;Decreased range of motion;Postural dysfunction;Decreased balance   Rehab Potential Good   Clinical Impairments Affecting Rehab Potential positive indicators: motivated, family support  negative indicators: chronicity   PT Frequency 1x / week   PT Duration 4 weeks   PT Treatment/Interventions Canalith Repostioning;Patient/family education;Balance training;Neuromuscular re-education;Vestibular   PT Next Visit Plan Continue habituation exercises, progress high level balance. Pt to schedule ENT appointment   PT Home Exercise Plan VOR X 1 with forward/retro ambulation as well as with conflicting background, upper traps stretch, standing mountain climbers with horizontal head turns, ball dribbling during retro ambulation, seated head to knee/ceiling 2 x 10 each side 2x/day   Consulted and Agree with Plan of Care Patient        Problem List Patient Active Problem List   Diagnosis Date Noted  . Hypertension 03/22/2015  . TIA (transient ischemic attack) 03/22/2015  . OSA (obstructive sleep apnea) 03/22/2015    Lynnea Maizes PT, DPT    Huprich,Jason 08/21/2015, 10:55 AM  [facility] MAIN [facility] SERVICES [address] [place], [place], [address] Phone: [phone]   Fax:  [phone]

## 2015-08-28 ENCOUNTER — Encounter: Payer: Self-pay | Admitting: Physical Therapy

## 2015-08-28 ENCOUNTER — Ambulatory Visit: Payer: BC Managed Care – PPO | Admitting: Physical Therapy

## 2015-08-28 DIAGNOSIS — R42 Dizziness and giddiness: Secondary | ICD-10-CM | POA: Diagnosis not present

## 2015-08-28 NOTE — Therapy (Signed)
[facility]IN [facility] [address] [place], [place], [address] Phone: [phone]   Fax:  [phone]  Physical Therapy Treatment  Patient Details  Name: Shane MarseillesMelvin Boxley Jr. MRN: [id number] Date of Birth: 1961/04/02 No Data Recorded  Encounter Date: 08/28/2015      PT End of Session - 08/28/15 1550    Visit Number 10   Number of Visits 14   Date for PT Re-Evaluation 09/15/15   Authorization Type no go codes   PT Start Time 1054   PT Stop Time 1132   PT Time Calculation (min) 38 min   Equipment Utilized During Treatment Gait belt   Activity Tolerance Patient tolerated treatment well   Behavior During Therapy WFL for tasks assessed/performed      Past Medical History  Diagnosis Date  . Hypertension     Past Surgical History  Procedure Laterality Date  . No past surgeries      There were no vitals filed for this visit.  Visit Diagnosis:  Dizziness and giddiness      Subjective Assessment - 08/28/15 1546    Subjective Pt states that he has been doing better but states he is still having some dizziness episodes at times. Pt states he had an episode of dizziness while at work, but states he was able to keep working. Pt reports that he was able to get an apointment with ENT physician next Monday.       Neuromuscular Re-education:  Walking while scanning for visual targets: Performed 170' trials of forwards and retro ambulation while scanning for visual targets in hallway with CGA.  Card Sorting Activity:  Pt performed deck of card sorting activity on mat table sorting by numbers and then turning to place cards on low chair to incorporate vertical head turning and body turns as these are symptom aggravating moves for patient. Pt reports mild dizziness 4/10 with this activity.   Ball Sorting Activity: On firm surface, performed transferring multicolored balls from bin placed on floor to another bin placed 180 degrees on  the other side of patient while patient visually tracks the ball so that patient is required to turn 180 degrees L and R to turn to bend over and pick and place balls in the bins with CGA. Pt with evidence of mild imbalance that patient was able to self-correct and patient reports increase in symptoms with this activity. Pt required multiple short standing rest breaks less than one minute each.          PT Education - 08/28/15 1549    Education provided Yes   Education Details HEP reinforced, perform daily. Discussed patient performing sorting activity similar to activity done in the clinic this date.    Person(s) Educated Patient   Methods Explanation   Comprehension Verbalized understanding             PT Long Term Goals - 08/14/15 0933    PT LONG TERM GOAL #1   Title Patient will be able to perform home program independently for self-management.   Time 8   Period Weeks   Status Achieved   PT LONG TERM GOAL #2   Title Patient will have demonstrate decreased falls risk as indicated by Activities Specific Balance Confidence Scale score of 80% or greater.   Baseline 08/14/15: 88.75%   Time 8   Period Weeks   Status Achieved   PT LONG TERM GOAL #3   Title Patient reports worst dizziness of 5/10  with provoking motions or positions by 09/15/15   Baseline 08/14/15: every other day, worst: 8/10, best: 0/10   Time 8   Period Weeks   Status Revised   PT LONG TERM GOAL #4   Title Patient will reduce perceived disability to low levels as indicated by <40 on Dizziness Handicap Inventory.   Baseline 08/14/15: 16/100   Time 8   Period Weeks   Status Achieved               Plan - 08/29/15 1227    Clinical Impression Statement Pt reporting improvement of his symptoms and reports only one episode of dizziness this past week. Pt did well ambulating in busy visual environement with head turning and object finding tasks this date but reports ball sorting activity recreated his  symptoms. Continue PT services working on addressing patients subjective symptoms of dizziness and goals as set on POC.   Pt will benefit from skilled therapeutic intervention in order to improve on the following deficits Dizziness;Decreased range of motion;Postural dysfunction;Decreased balance   Rehab Potential Good   Clinical Impairments Affecting Rehab Potential positive indicators: motivated, family support  negative indicators: chronicity   PT Frequency 1x / week   PT Duration 4 weeks   PT Treatment/Interventions Canalith Repostioning;Patient/family education;Balance training;Neuromuscular re-education;Vestibular   PT Home Exercise Plan VOR X 1 with forward/retro ambulation as well as with conflicting background, upper traps stretch, standing mountain climbers with horizontal head turns, ball dribbling during retro ambulation, seated head to knee/ceiling 2 x 10 each side 2x/day   Consulted and Agree with Plan of Care Patient        Problem List Patient Active Problem List   Diagnosis Date Noted  . Hypertension 03/22/2015  . TIA (transient ischemic attack) 03/22/2015  . OSA (obstructive sleep apnea) 03/22/2015   Mardelle Matte PT, DPT Mardelle Matte 08/29/2015, 12:29 PM  [facility] MAIN [facility] SERVICES [address] [place], [place], [address] Phone: [phone]   Fax:  [phone]  Name: Shane Smith. MRN: [id number] Date of Birth: 03-Jul-1961

## 2015-09-04 ENCOUNTER — Ambulatory Visit: Payer: BC Managed Care – PPO | Admitting: Physical Therapy

## 2015-09-04 ENCOUNTER — Encounter: Payer: Self-pay | Admitting: Physical Therapy

## 2015-09-04 VITALS — BP 150/76

## 2015-09-04 DIAGNOSIS — R42 Dizziness and giddiness: Secondary | ICD-10-CM

## 2015-09-04 NOTE — Therapy (Signed)
[place] MAIN [facility] SERVICES [address] [place], [place], [address] Phone: [phone]   Fax:  [phone]  Physical Therapy Treatment/ Discharge Summary  Patient Details  Name: Shane Smith. MRN: [id number] Date of Birth: 1961-08-01 No Data Recorded  Encounter Date: 09/04/2015      PT End of Session - 09/04/15 1348    Visit Number 11   Number of Visits 14   Date for PT Re-Evaluation 09/15/15   Authorization Type no go codes   PT Start Time 1309   PT Stop Time 1345   PT Time Calculation (min) 36 min   Activity Tolerance Patient tolerated treatment well   Behavior During Therapy [facility] for tasks assessed/performed      Past Medical History  Diagnosis Date  . Hypertension     Past Surgical History  Procedure Laterality Date  . No past surgeries      Filed Vitals:   09/04/15 1346  BP: 150/76    Visit Diagnosis:  Dizziness and giddiness      Subjective Assessment - 09/04/15 1312    Subjective Pt states that he had his ENT appointment this morning. Pt states that they are going to put him on a nasal steroid and next week he is having a VNG test on Tuesday. Pt reports that he did not have any episodes of dizziness this past week. Pt states that he is much better than when he first started therapy. Pt reports that he has not been getting dizzy when working out at home and that he has been able to work.       Neuromuscular Re-education: Pt performed multiple 150' trial of retro ambulation while tossing ball over one shoulder with return catch over opposite shoulder varying the ball position to head, shoulder and waist level to promote head turning and tilting. Pt reports 3/10 dizziness with this activity.     Ball Sorting Activity: On firm surface, performed transferring multicolored balls and some weighted balls up to 10 pounds from bin placed on floor to another bin placed 180 degrees on the other side of patient while  patient visually tracks the ball so that patient is required to turn 180 degrees L and R to turn to bend over and pick and place balls in the bins with S. Pt with no evidence of imbalance this date.  In gym, worked on picking up 10 or 15 pound dumbbells from the floor, carrying the dumbbell 25' up small incline ramp and raising dumbbell over head and back down to waist then doing a quick turn and returning to the starting place with another quick turn at the end. Repeated several trials of this activity.  Then, picked up dumbbell from floor and did alternating diagonal raises of dumbbell over shoulder with eyes and head tracking dumbbell 10 reps.    Discussed POC and progress towards goals. Pt reporting improvement in his symptoms and is in agreement to discharge from PT services at this time. Pt plans on continuing to perform his HEP upon discharge.       PT Education - 09/04/15 1348    Education provided Yes   Education Details Reviewed HEP; pt reports that he has his HEP and that he has been doing HEP without diffiuclty. Pt reports he does not have any questions about his HEP.    Person(s) Educated Patient   Methods Explanation   Comprehension Verbalized understanding  PT Long Term Goals - 09/04/15 1355    PT LONG TERM GOAL #1   Title Patient will be able to perform home program independently for self-management.   Time 8   Period Weeks   Status Achieved   PT LONG TERM GOAL #2   Title Patient will have demonstrate decreased falls risk as indicated by Activities Specific Balance Confidence Scale score of 80% or greater.   Baseline 08/14/15: 88.75%   Time 8   Period Weeks   Status Achieved   PT LONG TERM GOAL #3   Title Patient reports worst dizziness of 5/10 with provoking motions or positions by 09/15/15   Baseline 08/14/15: every other day, worst: 8/10, best: 0/10   Time 8   Status Achieved   PT LONG TERM GOAL #4   Title Patient will reduce perceived disability to  low levels as indicated by <40 on Dizziness Handicap Inventory.   Baseline 08/14/15: 16/100   Time 8   Period Weeks   Status Achieved               Plan - 09/04/15 1355    Clinical Impression Statement Pt has met all goals as stated on POC. Pt reports good overall improvements in his symptoms but does state that he gets brief, mild dizziness occasionally. Pt reports the episodes of dizziness are markedly reduced in frequency and intensity. Pt has been able to return to all of his prior activities. Pt in agreement that he is ready for discharge from PT services at this time.    Pt will benefit from skilled therapeutic intervention in order to improve on the following deficits Dizziness;Decreased range of motion;Postural dysfunction;Decreased balance   Rehab Potential Good   Clinical Impairments Affecting Rehab Potential positive indicators: motivated, family support  negative indicators: chronicity   PT Frequency 1x / week   PT Duration 4 weeks   PT Treatment/Interventions Canalith Repostioning;Patient/family education;Balance training;Neuromuscular re-education;Vestibular   PT Home Exercise Plan VOR X 1 with forward/retro ambulation as well as with conflicting background, upper traps stretch, standing mountain climbers with horizontal head turns, ball dribbling during retro ambulation, seated head to knee/ceiling 2 x 10 each side 2x/day   Consulted and Agree with Plan of Care Patient        Problem List Patient Active Problem List   Diagnosis Date Noted  . Hypertension 03/22/2015  . TIA (transient ischemic attack) 03/22/2015  . OSA (obstructive sleep apnea) 03/22/2015   Lady Deutscher PT, DPT Lady Deutscher 09/04/2015, 3:19 PM  [place] MAIN [facility] SERVICES [address] [place], [place], [address] Phone: [phone]   Fax:  [phone]  Name: Shane Smith. MRN: [id number] Date of Birth: 1961/11/08

## 2016-10-01 ENCOUNTER — Encounter: Payer: Self-pay | Admitting: Emergency Medicine

## 2016-10-01 ENCOUNTER — Emergency Department
Admission: EM | Admit: 2016-10-01 | Discharge: 2016-10-01 | Disposition: A | Discharge status: Home / Self Care | Payer: BC Managed Care – PPO | Attending: Emergency Medicine | Admitting: Emergency Medicine

## 2016-10-01 DIAGNOSIS — H8391 Unspecified disease of right inner ear: Secondary | ICD-10-CM

## 2016-10-01 DIAGNOSIS — Z79899 Other long term (current) drug therapy: Secondary | ICD-10-CM | POA: Insufficient documentation

## 2016-10-01 DIAGNOSIS — H8191 Unspecified disorder of vestibular function, right ear: Secondary | ICD-10-CM | POA: Diagnosis not present

## 2016-10-01 DIAGNOSIS — Z87891 Personal history of nicotine dependence: Secondary | ICD-10-CM | POA: Insufficient documentation

## 2016-10-01 DIAGNOSIS — Z791 Long term (current) use of non-steroidal anti-inflammatories (NSAID): Secondary | ICD-10-CM | POA: Insufficient documentation

## 2016-10-01 DIAGNOSIS — R42 Dizziness and giddiness: Secondary | ICD-10-CM | POA: Diagnosis present

## 2016-10-01 DIAGNOSIS — Z7982 Long term (current) use of aspirin: Secondary | ICD-10-CM | POA: Insufficient documentation

## 2016-10-01 DIAGNOSIS — I10 Essential (primary) hypertension: Secondary | ICD-10-CM | POA: Insufficient documentation

## 2016-10-01 HISTORY — DX: Sleep apnea, unspecified: G47.30

## 2016-10-01 LAB — CBC
HCT: 45.7 % (ref 40.0–52.0)
Hemoglobin: 15.3 g/dL (ref 13.0–18.0)
MCH: 27.4 pg (ref 26.0–34.0)
MCHC: 33.4 g/dL (ref 32.0–36.0)
MCV: 82 fL (ref 80.0–100.0)
PLATELETS: 196 10*3/uL (ref 150–440)
RBC: 5.58 MIL/uL (ref 4.40–5.90)
RDW: 15.6 % — AB (ref 11.5–14.5)
WBC: 7.6 10*3/uL (ref 3.8–10.6)

## 2016-10-01 LAB — BASIC METABOLIC PANEL
Anion gap: 8 (ref 5–15)
BUN: 16 mg/dL (ref 6–20)
CALCIUM: 9.6 mg/dL (ref 8.9–10.3)
CO2: 27 mmol/L (ref 22–32)
CREATININE: 0.8 mg/dL (ref 0.61–1.24)
Chloride: 102 mmol/L (ref 101–111)
Glucose, Bld: 109 mg/dL — ABNORMAL HIGH (ref 65–99)
Potassium: 3.3 mmol/L — ABNORMAL LOW (ref 3.5–5.1)
SODIUM: 137 mmol/L (ref 135–145)

## 2016-10-01 LAB — URINALYSIS COMPLETE WITH MICROSCOPIC (ARMC ONLY)
BILIRUBIN URINE: NEGATIVE
Bacteria, UA: NONE SEEN
Glucose, UA: NEGATIVE mg/dL
Hgb urine dipstick: NEGATIVE
KETONES UR: NEGATIVE mg/dL
LEUKOCYTES UA: NEGATIVE
NITRITE: NEGATIVE
PH: 7 (ref 5.0–8.0)
Protein, ur: NEGATIVE mg/dL
SPECIFIC GRAVITY, URINE: 1.002 — AB (ref 1.005–1.030)
SQUAMOUS EPITHELIAL / LPF: NONE SEEN
WBC, UA: NONE SEEN WBC/hpf (ref 0–5)

## 2016-10-01 LAB — GLUCOSE, CAPILLARY: Glucose-Capillary: 109 mg/dL — ABNORMAL HIGH (ref 65–99)

## 2016-10-01 MED ORDER — POTASSIUM CHLORIDE CRYS ER 20 MEQ PO TBCR
EXTENDED_RELEASE_TABLET | ORAL | Status: AC
  Administered 2016-10-01: 40 meq via ORAL
  Filled 2016-10-01: qty 2

## 2016-10-01 MED ORDER — POTASSIUM CHLORIDE CRYS ER 20 MEQ PO TBCR
40.0000 meq | EXTENDED_RELEASE_TABLET | Freq: Once | ORAL | Status: AC
  Administered 2016-10-01: 40 meq via ORAL

## 2016-10-01 NOTE — ED Triage Notes (Signed)
Patient presents to the ED with two episodes of dizziness/near syncope yesterday while patient [place] walking [place] the mall.  Patient reports hearing ringing in his ears.  Patient reports episodes lasted several minutes each.  Patient states, "today I feel fine, my ears still hurt a little."  Patient is in no obvious distress at this time.

## 2016-10-01 NOTE — ED Notes (Signed)
Pt states yesterday nearly passed out, states he lost his balance. C/o bilat ear pain, L more than R. Denies hearing loss. States hx of vertigo, does not take antivert currently. Ambulatory to treatment room, states he feels off balance but steady gait. [facility].

## 2016-10-01 NOTE — ED Provider Notes (Signed)
[facility] Emergency Department Provider Note   ____________________________________________   First MD Initiated Contact with Patient 10/01/16 1958     (approximate)  I have reviewed the triage vital signs and the nursing notes.   HISTORY  Chief Complaint Near Syncope    HPI Shane MarseillesMelvin Sookram Jr. is a 55 y.o. male here for evaluation of 2 brief episodes of feeling slight dizziness. Reports a long history of inner ear trouble, and yesterday he felt as though is a slight pressure difference in his right ear as though "on an airplane". He noticed throughout the day and while walking at the mall that he had a very brief feeling of dizziness that lasted no more than 20-30 seconds, and then about another 5 minutes later experienced one more episode while walking. Denies any lightheadedness, no nausea no vomiting. No chest pain assures of breath. No numbness or tingling, no weaknessor trouble speaking.  Patient reports this happened him many times in the past, and his wife recommended he come to get this checked out. Reports in the past been diagnosed as inner ear dysfunction, and improved after doing vestibular therapy. Has Antivert at home, but reports he doesn't use it.  History of high blood pressure.   Past Medical History:  Diagnosis Date  . Hypertension   . Sleep apnea     Patient Active Problem List   Diagnosis Date Noted  . Hypertension 03/22/2015  . TIA (transient ischemic attack) 03/22/2015  . OSA (obstructive sleep apnea) 03/22/2015    Past Surgical History:  Procedure Laterality Date  . NO PAST SURGERIES      Prior to Admission medications   Medication Sig Start Date End Date Taking? Authorizing Provider  amLODipine (NORVASC) 10 MG tablet Take 10 mg by mouth daily.    Historical Provider, MD  aspirin EC 325 MG tablet Take 1 tablet (325 mg total) by mouth daily. 03/23/15   Ramonita LabAruna Gouru, MD  benazepril (LOTENSIN) 40 MG tablet Take 40 mg by  mouth daily.    Historical Provider, MD  hydrALAZINE (APRESOLINE) 25 MG tablet Take 25 mg by mouth 3 (three) times daily.    Historical Provider, MD  hydrochlorothiazide (HYDRODIURIL) 25 MG tablet Take 25 mg by mouth daily.    Historical Provider, MD  Multiple Vitamins-Minerals (MULTIVITAMIN PO) Take 1 tablet by mouth daily.    Historical Provider, MD  naproxen sodium (ANAPROX) 220 MG tablet Take 440 mg by mouth every morning.    Historical Provider, MD  nebivolol (BYSTOLIC) 10 MG tablet Take 10 mg by mouth daily.    Historical Provider, MD  Omega-3 Fatty Acids (FISH OIL PO) Take 2 capsules by mouth daily.    Historical Provider, MD  rosuvastatin (CRESTOR) 20 MG tablet Take 1 tablet (20 mg total) by mouth daily. 03/23/15   Ramonita LabAruna Gouru, MD    Allergies Patient has no known allergies.  Family History  Problem Relation Age of Onset  . Lupus Mother     Social History Social History  Substance Use Topics  . Smoking status: Former Smoker    Quit date: 03/21/1996  . Smokeless tobacco: Never Used  . Alcohol use No    Review of Systems Constitutional: No fever/chills Eyes: No visual changes. ENT: No sore throat. Cardiovascular: Denies chest pain. Respiratory: Denies shortness of breath. Gastrointestinal: No abdominal pain.  No nausea, no vomiting.  No diarrhea.  No constipation. Genitourinary: Negative for dysuria. Musculoskeletal: Negative for back pain. Skin: Negative for rash. Neurological: Negative  for headaches, focal weakness or numbness.  10-point ROS otherwise negative.  ____________________________________________   PHYSICAL EXAM:  VITAL SIGNS: ED Triage Vitals  Enc Vitals Group     BP 10/01/16 1803 (!) 153/83     Pulse Rate 10/01/16 1803 74     Resp 10/01/16 1803 18     Temp 10/01/16 1803 99.2 F (37.3 C)     Temp Source 10/01/16 1803 Oral     SpO2 10/01/16 1803 98 %     Weight 10/01/16 1754 (!) 308 lb (139.7 kg)     Height 10/01/16 1754 5\' 9"  (1.753 m)      Head Circumference --      Peak Flow --      Pain Score 10/01/16 1754 1     Pain Loc --      Pain Edu? --      Excl. in GC? --     Constitutional: Alert and oriented. Well appearing and in no acute distress. Very pleasant. Eyes: Conjunctivae are normal. PERRL. EOMI. Head: Atraumatic. Nose: No congestion/rhinnorhea. Left tympanic membrane normal. Right tympanic membrane slightly retracted, no erythema noted behind and no bowls noted. Does have a slight vertical orientation to both ear canals. Normal hearing in both by finger rub Mouth/Throat: Mucous membranes are moist.  Oropharynx non-erythematous. Neck: No stridor.   Cardiovascular: Normal rate, regular rhythm. Grossly normal heart sounds.  Good peripheral circulation. Respiratory: Normal respiratory effort.  No retractions. Lungs CTAB. Gastrointestinal: Soft and nontender.  Musculoskeletal: No lower extremity tenderness nor edema.  No joint effusions. Neurologic:  Normal speech and language. No gross focal neurologic deficits are appreciated. No gait instability. Skin:  Skin is warm, dry and intact. No rash noted. Psychiatric: Mood and affect are normal. Speech and behavior are normal.  ____________________________________________   LABS (all labs ordered are listed, but only abnormal results are displayed)  Labs Reviewed  BASIC METABOLIC PANEL - Abnormal; Notable for the following:       Result Value   Potassium 3.3 (*)    Glucose, Bld 109 (*)    All other components within normal limits  CBC - Abnormal; Notable for the following:    RDW 15.6 (*)    All other components within normal limits  URINALYSIS COMPLETEWITH MICROSCOPIC ([facility] ONLY) - Abnormal; Notable for the following:    Color, Urine COLORLESS (*)    APPearance CLEAR (*)    Specific Gravity, Urine 1.002 (*)    All other components within normal limits  GLUCOSE, CAPILLARY - Abnormal; Notable for the following:    Glucose-Capillary 109 (*)    All other components  within normal limits   ____________________________________________  EKG  Reviewed and are me at 1800 Ventricular rate 75 PR 140 QRS 100 QTc 4:30 Normal sinus rhythm, repolarization abnormality consistent with LVH as well as meeting voltage criteria with mild T-wave abnormality noted in leads 3 and aVF. No significant changes noted from comparison of previous EKG from July 2016 ____________________________________________  RADIOLOGY   ____________________________________________   PROCEDURES  Procedure(s) performed: None  Procedures  Critical Care performed: No  ____________________________________________   INITIAL IMPRESSION / ASSESSMENT AND PLAN / ED COURSE  Pertinent labs & imaging results that were available during my care of the patient were reviewed by me and considered in my medical decision making (see chart for details).  Patient here for evaluation of 2 brief episodes of feeling slightly dizzy. He reports in the setting of feeling pressure behind the right ear, and  same in the past before. No associated neurologic symptoms. No cardiac or pulmonary symptoms. Reassuring and normal examination at this time except for a slightly retracted right eardrum and, slightly unusual anatomy of both ear canals and that they tend to ambulate somewhat superiorly  History patient gives me is quite different than that reported by urgent care. Patient denies that he almost passed out twice, he reports just a brief feeling of lightheadedness, and reports he's had the same before behavior problems.  EKG LVH, patient reports history of currently on treatment. The pressure presently slightly elevated, but recently had a hydralazine increase. Potassium was notably slightly low, and given medications he is taking  hydrochlorothiazide likely related. We'll replete here  ----------------------------------------- 8:14 PM on 10/01/2016 -----------------------------------------  Discussed  return precautions, recommended follow up with ear nose and throat. Patient agreeable Return precautions and treatment recommendations and follow-up discussed with the patient who is agreeable with the plan.  Offered Antivert prescription, patient reports he has at home but has not yet taken.   Clinical Course      ____________________________________________   FINAL CLINICAL IMPRESSION(S) / ED DIAGNOSES  Final diagnoses:  Vertigo  Inner ear dysfunction, right      NEW MEDICATIONS STARTED DURING THIS VISIT:  New Prescriptions   No medications on file     Note:  This document was prepared using Dragon voice recognition software and may include unintentional dictation errors.     Sharyn CreamerMark Gopal Malter, MD 10/01/16 2017

## 2016-10-01 NOTE — Discharge Instructions (Signed)
If you develop any new or worsening symptoms that concern you, including but not limited to persistent dizziness/vertigo, numbness or weakness in your arms or legs, altered mental status, persistent vomiting, or fever greater than 100, please return immediately to the Emergency Department.

## 2017-03-01 ENCOUNTER — Observation Stay: Payer: BC Managed Care – PPO

## 2017-03-01 ENCOUNTER — Emergency Department: Payer: BC Managed Care – PPO

## 2017-03-01 ENCOUNTER — Observation Stay
Admission: EM | Admit: 2017-03-01 | Discharge: 2017-03-02 | Disposition: A | Discharge status: Home / Self Care | Payer: BC Managed Care – PPO | Attending: Internal Medicine | Admitting: Internal Medicine

## 2017-03-01 DIAGNOSIS — Z87891 Personal history of nicotine dependence: Secondary | ICD-10-CM | POA: Diagnosis not present

## 2017-03-01 DIAGNOSIS — Z7951 Long term (current) use of inhaled steroids: Secondary | ICD-10-CM | POA: Insufficient documentation

## 2017-03-01 DIAGNOSIS — G454 Transient global amnesia: Principal | ICD-10-CM | POA: Insufficient documentation

## 2017-03-01 DIAGNOSIS — E876 Hypokalemia: Secondary | ICD-10-CM | POA: Diagnosis not present

## 2017-03-01 DIAGNOSIS — R410 Disorientation, unspecified: Secondary | ICD-10-CM | POA: Diagnosis present

## 2017-03-01 DIAGNOSIS — Z6841 Body Mass Index (BMI) 40.0 and over, adult: Secondary | ICD-10-CM | POA: Diagnosis not present

## 2017-03-01 DIAGNOSIS — Z791 Long term (current) use of non-steroidal anti-inflammatories (NSAID): Secondary | ICD-10-CM | POA: Insufficient documentation

## 2017-03-01 DIAGNOSIS — R4182 Altered mental status, unspecified: Secondary | ICD-10-CM | POA: Diagnosis not present

## 2017-03-01 DIAGNOSIS — Z79899 Other long term (current) drug therapy: Secondary | ICD-10-CM | POA: Insufficient documentation

## 2017-03-01 DIAGNOSIS — Z7982 Long term (current) use of aspirin: Secondary | ICD-10-CM | POA: Insufficient documentation

## 2017-03-01 DIAGNOSIS — I10 Essential (primary) hypertension: Secondary | ICD-10-CM | POA: Diagnosis not present

## 2017-03-01 DIAGNOSIS — G4733 Obstructive sleep apnea (adult) (pediatric): Secondary | ICD-10-CM | POA: Insufficient documentation

## 2017-03-01 LAB — CBC
HCT: 43.8 % (ref 40.0–52.0)
Hemoglobin: 14.6 g/dL (ref 13.0–18.0)
MCH: 27.2 pg (ref 26.0–34.0)
MCHC: 33.3 g/dL (ref 32.0–36.0)
MCV: 81.8 fL (ref 80.0–100.0)
PLATELETS: 183 10*3/uL (ref 150–440)
RBC: 5.35 MIL/uL (ref 4.40–5.90)
RDW: 15.2 % — ABNORMAL HIGH (ref 11.5–14.5)
WBC: 6.9 10*3/uL (ref 3.8–10.6)

## 2017-03-01 LAB — COMPREHENSIVE METABOLIC PANEL
ALBUMIN: 4.1 g/dL (ref 3.5–5.0)
ALK PHOS: 61 U/L (ref 38–126)
ALT: 24 U/L (ref 17–63)
ANION GAP: 10 (ref 5–15)
AST: 29 U/L (ref 15–41)
BUN: 15 mg/dL (ref 6–20)
CALCIUM: 8.6 mg/dL — AB (ref 8.9–10.3)
CHLORIDE: 100 mmol/L — AB (ref 101–111)
CO2: 25 mmol/L (ref 22–32)
Creatinine, Ser: 0.72 mg/dL (ref 0.61–1.24)
GFR calc Af Amer: >60 mL/min (ref >60)
GFR calc non Af Amer: >60 mL/min (ref >60)
GLUCOSE: 137 mg/dL — AB (ref 65–99)
Potassium: 2.7 mmol/L — CL (ref 3.5–5.1)
SODIUM: 135 mmol/L (ref 135–145)
Total Bilirubin: 0.7 mg/dL (ref 0.3–1.2)
Total Protein: 8.2 g/dL — ABNORMAL HIGH (ref 6.5–8.1)

## 2017-03-01 LAB — URINALYSIS, ROUTINE W REFLEX MICROSCOPIC
BACTERIA UA: NONE SEEN
BILIRUBIN URINE: NEGATIVE
GLUCOSE, UA: NEGATIVE mg/dL
HGB URINE DIPSTICK: NEGATIVE
KETONES UR: NEGATIVE mg/dL
LEUKOCYTES UA: NEGATIVE
NITRITE: NEGATIVE
Protein, ur: 30 mg/dL — AB
Specific Gravity, Urine: 1.008 (ref 1.005–1.030)
Squamous Epithelial / LPF: NONE SEEN
WBC, UA: NONE SEEN WBC/hpf (ref 0–5)
pH: 7 (ref 5.0–8.0)

## 2017-03-01 LAB — URINE DRUG SCREEN, QUALITATIVE (ARMC ONLY)
AMPHETAMINES, UR SCREEN: NOT DETECTED
BENZODIAZEPINE, UR SCRN: NOT DETECTED
Barbiturates, Ur Screen: NOT DETECTED
Cannabinoid 50 Ng, Ur ~~LOC~~: NOT DETECTED
Cocaine Metabolite,Ur ~~LOC~~: NOT DETECTED
MDMA (Ecstasy)Ur Screen: NOT DETECTED
METHADONE SCREEN, URINE: NOT DETECTED
OPIATE, UR SCREEN: NOT DETECTED
PHENCYCLIDINE (PCP) UR S: NOT DETECTED
TRICYCLIC, UR SCREEN: NOT DETECTED

## 2017-03-01 LAB — GLUCOSE, CAPILLARY: GLUCOSE-CAPILLARY: 106 mg/dL — AB (ref 65–99)

## 2017-03-01 LAB — MAGNESIUM: MAGNESIUM: 1.6 mg/dL — AB (ref 1.7–2.4)

## 2017-03-01 LAB — APTT: aPTT: 29 seconds (ref 24–36)

## 2017-03-01 LAB — TROPONIN I: Troponin I: <0.03 ng/mL (ref <0.03)

## 2017-03-01 LAB — PROTIME-INR
INR: 0.91
Prothrombin Time: 12.2 seconds (ref 11.4–15.2)

## 2017-03-01 MED ORDER — AMLODIPINE BESYLATE 10 MG PO TABS
10.0000 mg | ORAL_TABLET | Freq: Every day | ORAL | Status: DC
  Administered 2017-03-02: 09:00:00 10 mg via ORAL
  Filled 2017-03-01: qty 1

## 2017-03-01 MED ORDER — ATORVASTATIN CALCIUM 20 MG PO TABS
20.0000 mg | ORAL_TABLET | Freq: Every evening | ORAL | Status: DC
  Administered 2017-03-01: 20 mg via ORAL
  Filled 2017-03-01: qty 1

## 2017-03-01 MED ORDER — SODIUM CHLORIDE 0.9 % IV SOLN
1000.0000 mL | Freq: Once | INTRAVENOUS | Status: AC
  Administered 2017-03-01: 1000 mL via INTRAVENOUS

## 2017-03-01 MED ORDER — ACETAMINOPHEN 650 MG RE SUPP
650.0000 mg | RECTAL | Status: DC | PRN
Start: 2017-03-01 — End: 2017-03-02

## 2017-03-01 MED ORDER — SODIUM CHLORIDE 0.9 % IV SOLN
INTRAVENOUS | Status: DC
  Administered 2017-03-01 – 2017-03-02 (×2): via INTRAVENOUS

## 2017-03-01 MED ORDER — ENOXAPARIN SODIUM 40 MG/0.4ML ~~LOC~~ SOLN
40.0000 mg | Freq: Two times a day (BID) | SUBCUTANEOUS | Status: DC
  Administered 2017-03-01 – 2017-03-02 (×2): 40 mg via SUBCUTANEOUS
  Filled 2017-03-01 (×2): qty 0.4

## 2017-03-01 MED ORDER — BENAZEPRIL HCL 20 MG PO TABS
40.0000 mg | ORAL_TABLET | Freq: Every day | ORAL | Status: DC
  Administered 2017-03-01 – 2017-03-02 (×2): 40 mg via ORAL
  Filled 2017-03-01 (×2): qty 1

## 2017-03-01 MED ORDER — FLUTICASONE PROPIONATE 50 MCG/ACT NA SUSP
2.0000 | Freq: Every day | NASAL | Status: DC
  Administered 2017-03-02: 09:00:00 2 via NASAL
  Filled 2017-03-01: qty 16

## 2017-03-01 MED ORDER — ONDANSETRON HCL 4 MG/2ML IJ SOLN: 4.0000 mg | Freq: Four times a day (QID) | INTRAMUSCULAR | Status: DC | PRN

## 2017-03-01 MED ORDER — HYDRALAZINE HCL 20 MG/ML IJ SOLN: 10.0000 mg | Freq: Four times a day (QID) | INTRAMUSCULAR | Status: DC | PRN

## 2017-03-01 MED ORDER — HYDROCHLOROTHIAZIDE 25 MG PO TABS
25.0000 mg | ORAL_TABLET | Freq: Every day | ORAL | Status: DC
Start: 2017-03-02 — End: 2017-03-02
  Administered 2017-03-02: 09:00:00 25 mg via ORAL
  Filled 2017-03-01: qty 1

## 2017-03-01 MED ORDER — ACETAMINOPHEN 325 MG PO TABS: 650.0000 mg | ORAL_TABLET | Freq: Four times a day (QID) | ORAL | Status: DC | PRN

## 2017-03-01 MED ORDER — NEBIVOLOL HCL 10 MG PO TABS
10.0000 mg | ORAL_TABLET | Freq: Every day | ORAL | Status: DC
  Administered 2017-03-02: 10 mg via ORAL
  Filled 2017-03-01: qty 1

## 2017-03-01 MED ORDER — ASPIRIN 81 MG PO CHEW
81.0000 mg | CHEWABLE_TABLET | Freq: Every day | ORAL | Status: DC
  Administered 2017-03-02: 81 mg via ORAL
  Filled 2017-03-01: qty 1

## 2017-03-01 MED ORDER — HYDRALAZINE HCL 50 MG PO TABS
25.0000 mg | ORAL_TABLET | Freq: Three times a day (TID) | ORAL | Status: DC
  Administered 2017-03-01 – 2017-03-02 (×3): 25 mg via ORAL
  Filled 2017-03-01 (×2): qty 1
  Filled 2017-03-01: qty 2

## 2017-03-01 MED ORDER — SENNOSIDES-DOCUSATE SODIUM 8.6-50 MG PO TABS
1.0000 | ORAL_TABLET | Freq: Every evening | ORAL | Status: DC | PRN
Start: 2017-03-01 — End: 2017-03-02

## 2017-03-01 MED ORDER — POTASSIUM CHLORIDE CRYS ER 20 MEQ PO TBCR
40.0000 meq | EXTENDED_RELEASE_TABLET | ORAL | Status: AC
  Administered 2017-03-01 (×2): 40 meq via ORAL
  Filled 2017-03-01 (×2): qty 2

## 2017-03-01 MED ORDER — ACETAMINOPHEN 325 MG PO TABS: 650.0000 mg | ORAL_TABLET | ORAL | Status: DC | PRN

## 2017-03-01 MED ORDER — ACETAMINOPHEN 160 MG/5ML PO SOLN
650.0000 mg | ORAL | Status: DC | PRN
Start: 2017-03-01 — End: 2017-03-02

## 2017-03-01 MED ORDER — STROKE: EARLY STAGES OF RECOVERY BOOK
Freq: Once | Status: AC
  Administered 2017-03-01: 16:00:00

## 2017-03-01 NOTE — ED Notes (Signed)
Pt reports not remembering wife being with him in ED earlier. Continues to ask about prior events. Denies remembering what happened today.

## 2017-03-01 NOTE — ED Notes (Addendum)
Date and time results received: 03/01/17 1406  (use smartphrase ".now" to insert current time)  Test: Potassium Critical Value: 2.7  Name of Provider Notified: Dr. Cyril Loosen  Orders Received? Or Actions Taken?: No new orders at this time.

## 2017-03-01 NOTE — ED Notes (Signed)
CODE STROKE CALLED TO 333 

## 2017-03-01 NOTE — Progress Notes (Signed)
PHARMACIST - PHYSICIAN COMMUNICATION  CONCERNING:  Enoxaparin (Lovenox) for DVT Prophylaxis    RECOMMENDATION: Patient was prescribed enoxaprin  q24 hours for VTE prophylaxis.   Filed Weights   03/01/17 1318  Weight: 300 lb (136.1 kg)    Body mass index is 45.61 kg/m.  Estimated Creatinine Clearance: 139.3 mL/min (by C-G formula based on SCr of 0.72 mg/dL).   Based on [facility] policy patient is candidate for enoxaparin  every 12 hour dosing due to BMI being >40.  DESCRIPTION: Pharmacy has adjusted enoxaparin dose per [facility] policy.  Patient is now receiving enoxaparin  every 12 hours.    Cher Nakai, PharmD, BCPS Clinical Pharmacist  03/01/2017 4:05 PM

## 2017-03-01 NOTE — H&P (Signed)
[facility] - [place] at [facility]   PATIENT NAME: Shane Smith    MR#:  [id number]  DATE OF BIRTH:  1961/07/04  DATE OF ADMISSION:  03/01/2017  PRIMARY CARE PHYSICIAN: Marisue Ivan, MD   REQUESTING/REFERRING PHYSICIAN: Jene Every, MD  CHIEF COMPLAINT:   Chief Complaint  Patient presents with  . Altered Mental Status   Confusion and memory loss. HISTORY OF PRESENT ILLNESS:  Shane Smith  is a 56 y.o. male with a known history of HTN and sleep apnea. Per wife patient was sent to ED since he was noticed confusion by his wife today. He mowed the lawn, but he did not know what he had done earlier in the day.Per neurology consult, the patient may has Transient global amnesia, need observation. CT head is unremarkable.  PAST MEDICAL HISTORY:   Past Medical History:  Diagnosis Date  . Hypertension   . Sleep apnea     PAST SURGICAL HISTORY:   Past Surgical History:  Procedure Laterality Date  . NO PAST SURGERIES      SOCIAL HISTORY:   Social History  Substance Use Topics  . Smoking status: Former Smoker    Quit date: 03/21/1996  . Smokeless tobacco: Never Used  . Alcohol use No    FAMILY HISTORY:   Family History  Problem Relation Age of Onset  . Lupus Mother     DRUG ALLERGIES:  No Known Allergies  REVIEW OF SYSTEMS:   Review of Systems  Constitutional: Negative for chills, fever and malaise/fatigue.  HENT: Negative for congestion, hearing loss, sore throat and tinnitus.   Eyes: Negative for blurred vision and double vision.  Respiratory: Negative for cough and shortness of breath.   Cardiovascular: Negative for chest pain and leg swelling.  Gastrointestinal: Negative for abdominal pain, blood in stool, melena, nausea and vomiting.  Genitourinary: Negative for dysuria and urgency.  Musculoskeletal: Negative for back pain.  Skin: Negative for itching and rash.  Neurological: Negative for dizziness, tingling, tremors, sensory  change, speech change, focal weakness, seizures, loss of consciousness and headaches.  Psychiatric/Behavioral: Negative for depression. The patient is not nervous/anxious.     MEDICATIONS AT HOME:   Prior to Admission medications   Medication Sig Start Date End Date Taking? Authorizing Provider  amLODipine (NORVASC) 10 MG tablet Take 10 mg by mouth daily.   Yes Historical Provider, MD  aspirin 81 MG chewable tablet Chew 81 mg by mouth daily.   Yes Historical Provider, MD  atorvastatin (LIPITOR) 20 MG tablet Take 1 tablet by mouth daily. 02/25/17  Yes Historical Provider, MD  benazepril (LOTENSIN) 40 MG tablet Take 40 mg by mouth daily.   Yes Historical Provider, MD  fluticasone (FLONASE) 50 MCG/ACT nasal spray Place 2 sprays into the nose daily.   Yes Historical Provider, MD  hydrALAZINE (APRESOLINE) 25 MG tablet Take 25 mg by mouth 3 (three) times daily.   Yes Historical Provider, MD  hydrochlorothiazide (HYDRODIURIL) 25 MG tablet Take 25 mg by mouth daily.   Yes Historical Provider, MD  Multiple Vitamins-Minerals (MULTIVITAMIN PO) Take 1 tablet by mouth daily.   Yes Historical Provider, MD  nebivolol (BYSTOLIC) 10 MG tablet Take 10 mg by mouth daily.   Yes Historical Provider, MD  Omega-3 Fatty Acids (FISH OIL PO) Take 2 capsules by mouth daily.   Yes Historical Provider, MD  aspirin EC 325 MG tablet Take 1 tablet (325 mg total) by mouth daily. Patient not taking: Reported on 03/01/2017 03/23/15  Ramonita Lab, MD  naproxen sodium (ANAPROX) 220 MG tablet Take 440 mg by mouth every morning.    Historical Provider, MD  rosuvastatin (CRESTOR) 20 MG tablet Take 1 tablet (20 mg total) by mouth daily. Patient not taking: Reported on 03/01/2017 03/23/15   Ramonita Lab, MD      VITAL SIGNS:  Blood pressure (!) 148/93, pulse 85, temperature 97.8 F (36.6 C), resp. rate 16, height  (1.727 m), weight 300 lb (136.1 kg), SpO2 99 %.  PHYSICAL EXAMINATION:  Physical Exam  GENERAL:  56 y.o.-year-old  patient lying in the bed with no acute distress. Morbid obese. EYES: Pupils equal, round, reactive to light and accommodation. No scleral icterus. Extraocular muscles intact.  HEENT: Head atraumatic, normocephalic. Oropharynx and nasopharynx clear.  NECK:  Supple, no jugular venous distention. No thyroid enlargement, no tenderness.  LUNGS: Normal breath sounds bilaterally, no wheezing, rales,rhonchi or crepitation. No use of accessory muscles of respiration.  CARDIOVASCULAR: S1, S2 normal. No murmurs, rubs, or gallops.  ABDOMEN: Soft, nontender, nondistended. Bowel sounds present. No organomegaly or mass.  EXTREMITIES: No pedal edema, cyanosis, or clubbing.  NEUROLOGIC: Cranial nerves II through XII are intact. Muscle strength 5/5 in all extremities. Sensation intact. Gait not checked.  PSYCHIATRIC: The patient is alert and oriented x 3.  SKIN: No obvious rash, lesion, or ulcer.   LABORATORY PANEL:   CBC  Recent Labs Lab 03/01/17 1316  WBC 6.9  HGB 14.6  HCT 43.8  PLT 183   ------------------------------------------------------------------------------------------------------------------  Chemistries   Recent Labs Lab 03/01/17 1316  NA 135  K 2.7*  CL 100*  CO2 25  GLUCOSE 137*  BUN 15  CREATININE 0.72  CALCIUM 8.6*  AST 29  ALT 24  ALKPHOS 61  BILITOT 0.7   ------------------------------------------------------------------------------------------------------------------  Cardiac Enzymes  Recent Labs Lab 03/01/17 1316  TROPONINI <0.03   ------------------------------------------------------------------------------------------------------------------  RADIOLOGY:  Ct Head Code Stroke Wo Contrast`  Addendum Date: 03/01/2017   ADDENDUM REPORT: 03/01/2017 14:11 ADDENDUM: These results were called by telephone at the time of interpretation on 03/01/2017 at 1:54 PM to Dr. Jene Every , who verbally acknowledged these results. Electronically Signed   By: Deatra Robinson M.D.   On: 03/01/2017 14:11   Result Date: 03/01/2017 CLINICAL DATA:  Code stroke. Hypertension, altered mental status and repetitive speech. EXAM: CT HEAD WITHOUT CONTRAST TECHNIQUE: Contiguous axial images were obtained from the base of the skull through the vertex without intravenous contrast. COMPARISON:  Head CT 05/20/2015 FINDINGS: Brain: No mass lesion, intraparenchymal hemorrhage or extra-axial collection. No evidence of acute cortical infarct. Brain parenchyma and CSF-containing spaces are normal for age. Vascular: No hyperdense vessel or unexpected calcification. Skull: Normal visualized skull base, calvarium and extracranial soft tissues. Sinuses/Orbits: No sinus fluid levels or advanced mucosal thickening. No mastoid effusion. Normal orbits. ASPECTS [facility] Stroke Program Early CT Score) - Ganglionic level infarction (caudate, lentiform nuclei, internal capsule, insula, M1-M3 cortex): 7 - Supraganglionic infarction (M4-M6 cortex): 3 Total score (0-10 with 10 being normal): 10 IMPRESSION: 1. Normal head CT. 2. ASPECTS is 10. I am currently attempting to contact Dr. Cyril Loosen at 1:46 p.m. Electronically Signed: By: Deatra Robinson M.D. On: 03/01/2017 13:47      IMPRESSION AND PLAN:   Transient global amnesia The patient will be placed for observation. neurocheck, MRI/MRA brain, echo, carotid US. Continue ASA and lipitor, check lipid.  Hypokalemia. Give KCl, f/u K and mag.  HTN. Continue HTN meds. Morbid obesity.  All the records are  reviewed and case discussed with ED provider. Management plans discussed with the patient, his wife and they are in agreement.  CODE STATUS: full code.  TOTAL TIME TAKING CARE OF THIS PATIENT: 48 minutes.    Shaune Pollack M.D on 03/01/2017 at 2:43 PM   Between 7am to 6pm - Pager - [phone]  After 6pm go to [email or web address] - Social research officer, government  [facility]  Office  [phone]  CC: Primary care physician;  Marisue Ivan, MD   Note: This dictation was prepared with Dragon dictation along with smaller phrase technology. Any transcriptional errors that result from this process are unintentional.

## 2017-03-01 NOTE — ED Triage Notes (Signed)
Pt presents via EMS with acute onset of confusion. Pt denies remembering what happened PTA. Repetitively asks what happened. Mowed grass this am but doesn't remember. Alert to self and situation. Unsure of year or president. Denies pain.

## 2017-03-01 NOTE — ED Provider Notes (Signed)
[facility] Emergency Department Provider Note   ____________________________________________    I have reviewed the triage vital signs and the nursing notes.   HISTORY  Chief Complaint Altered Mental Status   Patient is a poor historian, history is per wife  HPI Shane Smith. is a 56 y.o. male presented with complaints of memory loss. Per wife patient came in from mowing the lawn today confused and seemed to have lost his memory. He did not know what he had done earlier in the day. No focal weakness reported. He has never had this before. She reports he was normally woke up this morning. Patient feels well does not have any headaches. He does report some confusion. No fevers or chills or neck pain.   Past Medical History:  Diagnosis Date  . Hypertension   . Sleep apnea     Patient Active Problem List   Diagnosis Date Noted  . Amnesia, global, transient 03/01/2017  . Hypertension 03/22/2015  . TIA (transient ischemic attack) 03/22/2015  . OSA (obstructive sleep apnea) 03/22/2015    Past Surgical History:  Procedure Laterality Date  . NO PAST SURGERIES      Prior to Admission medications   Medication Sig Start Date End Date Taking? Authorizing Provider  amLODipine (NORVASC) 10 MG tablet Take 10 mg by mouth daily.   Yes Historical Provider, MD  aspirin 81 MG chewable tablet Chew 81 mg by mouth daily.   Yes Historical Provider, MD  atorvastatin (LIPITOR) 20 MG tablet Take 1 tablet by mouth daily. 02/25/17  Yes Historical Provider, MD  benazepril (LOTENSIN) 40 MG tablet Take 40 mg by mouth daily.   Yes Historical Provider, MD  fluticasone (FLONASE) 50 MCG/ACT nasal spray Place 2 sprays into the nose daily.   Yes Historical Provider, MD  hydrALAZINE (APRESOLINE) 25 MG tablet Take 25 mg by mouth 3 (three) times daily.   Yes Historical Provider, MD  hydrochlorothiazide (HYDRODIURIL) 25 MG tablet Take 25 mg by mouth daily.   Yes Historical  Provider, MD  Multiple Vitamins-Minerals (MULTIVITAMIN PO) Take 1 tablet by mouth daily.   Yes Historical Provider, MD  nebivolol (BYSTOLIC) 10 MG tablet Take 10 mg by mouth daily.   Yes Historical Provider, MD  Omega-3 Fatty Acids (FISH OIL PO) Take 2 capsules by mouth daily.   Yes Historical Provider, MD  aspirin EC 325 MG tablet Take 1 tablet (325 mg total) by mouth daily. Patient not taking: Reported on 03/01/2017 03/23/15   Ramonita Lab, MD  naproxen sodium (ANAPROX) 220 MG tablet Take 440 mg by mouth every morning.    Historical Provider, MD  rosuvastatin (CRESTOR) 20 MG tablet Take 1 tablet (20 mg total) by mouth daily. Patient not taking: Reported on 03/01/2017 03/23/15   Ramonita Lab, MD     Allergies Patient has no known allergies.  Family History  Problem Relation Age of Onset  . Lupus Mother     Social History Social History  Substance Use Topics  . Smoking status: Former Smoker    Quit date: 03/21/1996  . Smokeless tobacco: Never Used  . Alcohol use No    Review of Systems  Constitutional: No fever/chills Eyes: No visual changes.  ENT: No Neck pain Cardiovascular: Denies chest pain. Respiratory: Denies shortness of breath. Gastrointestinal: No abdominal pain.  No nausea, no vomiting.   Genitourinary: Negative for dysuria.Musculoskeletal: Negative for back pain. Skin: Negative for rash. Neurological: Negative for Focal weakness  10-point ROS otherwise negative.  ____________________________________________   PHYSICAL EXAM: VITAL SIGNS: ED Triage Vitals  Enc Vitals Group     BP 03/01/17 1313 (!) 148/93     Pulse Rate 03/01/17 1313 85     Resp 03/01/17 1313 16     Temp 03/01/17 1313 97.8 F (36.6 C)     Temp src --      SpO2 03/01/17 1313 99 %     Weight 03/01/17 1318 300 lb (136.1 kg)     Height 03/01/17 1318  (1.727 m)     Head Circumference --      Peak Flow --      Pain Score --      Pain Loc --      Pain Edu? --      Excl. in GC? --      Constitutional: Alert. No acute distress. Eyes: Conjunctivae are normal.  Head: Atraumatic. Nose: No congestion/rhinnorhea. Mouth/Throat: Mucous membranes are moist.   Neck:  Painless ROM Cardiovascular: Normal rate, regular rhythm. Grossly normal heart sounds.  Good peripheral circulation. Respiratory: Normal respiratory effort.  No retractions. Lungs CTAB. Gastrointestinal: Soft and nontender. No distention.  No CVA tenderness. Genitourinary: deferred Musculoskeletal:  Warm and well perfused Neurologic:  Normal speech and language. No gross focal neurologic deficits are appreciated. Patient frequently repeating himself, doesn't appear to remember anything that occurred earlier today. Does not know what month it is or who the president is Skin:  Skin is warm, dry and intact. No rash noted. Psychiatric: Mood and affect are normal. Speech and behavior are normal.  ____________________________________________   LABS (all labs ordered are listed, but only abnormal results are displayed)  Labs Reviewed  CBC - Abnormal; Notable for the following:       Result Value   RDW 15.2 (*)    All other components within normal limits  COMPREHENSIVE METABOLIC PANEL - Abnormal; Notable for the following:    Potassium 2.7 (*)    Chloride 100 (*)    Glucose, Bld 137 (*)    Calcium 8.6 (*)    Total Protein 8.2 (*)    All other components within normal limits  URINALYSIS, ROUTINE W REFLEX MICROSCOPIC - Abnormal; Notable for the following:    Color, Urine STRAW (*)    APPearance CLEAR (*)    Protein, ur 30 (*)    All other components within normal limits  GLUCOSE, CAPILLARY - Abnormal; Notable for the following:    Glucose-Capillary 106 (*)    All other components within normal limits  TROPONIN I  APTT  PROTIME-INR  URINE DRUG SCREEN, QUALITATIVE ([facility] ONLY)   ____________________________________________  EKG  ED ECG REPORT I, Jene Every, the attending physician, personally  viewed and interpreted this ECG.  Date: 03/01/2017 EKG Time: 1:15 PM Rate: 82 Rhythm: normal sinus rhythm QRS Axis: normal Intervals: normal ST/T Wave abnormalities: normal Conduction Disturbances: none Narrative Interpretation: unremarkable  ____________________________________________  RADIOLOGY  CT head unremarkable ____________________________________________   PROCEDURES  Procedure(s) performed: No    Critical Care performed: No ____________________________________________   INITIAL IMPRESSION / ASSESSMENT AND PLAN / ED COURSE  Pertinent labs & imaging results that were available during my care of the patient were reviewed by me and considered in my medical decision making (see chart for details).  Patient overall well-appearing and in no acute distress, appears to have some amnesia. Unclear whether this could be related to a CVA. Code stroke called  Neurologist feels this is transient global amnesia. No acute intervention  recommended at this time but admission for observation is recommended, discussed with hospitalist    ____________________________________________   FINAL CLINICAL IMPRESSION(S) / ED DIAGNOSES  Final diagnoses:  Transient global amnesia      NEW MEDICATIONS STARTED DURING THIS VISIT:  New Prescriptions   No medications on file     Note:  This document was prepared using Dragon voice recognition software and may include unintentional dictation errors.    Jene Every, MD 03/01/17 [id number]

## 2017-03-01 NOTE — ED Notes (Signed)
Pt very repetitive. Repeating same questions regarding prior events that lead to being in ED. Reports no memory of anything today. Repetitively asking "was I mowing the grass". Shoes stained green. Wife reports symptoms first discovered at 1200 today. Normal this am per wife report.

## 2017-03-01 NOTE — ED Notes (Signed)
SOC in progress.  

## 2017-03-01 NOTE — Progress Notes (Signed)
Chaplain responded to a page for Code Stroke of a Pt in ED Rm25. Shane Smith met Pt and Pt's wife was at the bedside. Pt was alert and talked briefly with the [facility]. Pt's wife talked about events prior to coming to hospital. Pt did not recall what happened to him at home and asked his wife to clarify what happened. [place] provided support and presence. [place] to arrange a follow up visit with this Pt as needed.    03/01/17 1500  Clinical Encounter Type  Visited With Patient;Family  Visit Type Initial;Code;Trauma  Referral From Nurse  Consult/Referral To Chaplain  Spiritual Encounters  Spiritual Needs Prayer;Emotional;Other (Comment)

## 2017-03-01 NOTE — ED Notes (Signed)
Patient transported to CT 

## 2017-03-02 ENCOUNTER — Observation Stay (HOSPITAL_BASED_OUTPATIENT_CLINIC_OR_DEPARTMENT_OTHER)
Admit: 2017-03-02 | Discharge: 2017-03-02 | Disposition: A | Discharge status: Home / Self Care | Payer: BC Managed Care – PPO | Attending: Internal Medicine | Admitting: Internal Medicine

## 2017-03-02 ENCOUNTER — Observation Stay: Payer: BC Managed Care – PPO

## 2017-03-02 DIAGNOSIS — G459 Transient cerebral ischemic attack, unspecified: Secondary | ICD-10-CM

## 2017-03-02 LAB — LIPID PANEL
CHOLESTEROL: 125 mg/dL (ref 0–200)
HDL: 41 mg/dL (ref >40)
LDL Cholesterol: 74 mg/dL (ref 0–99)
TRIGLYCERIDES: 51 mg/dL (ref <150)
Total CHOL/HDL Ratio: 3 RATIO
VLDL: 10 mg/dL (ref 0–40)

## 2017-03-02 LAB — POTASSIUM: Potassium: 3.8 mmol/L (ref 3.5–5.1)

## 2017-03-02 MED ORDER — ALPRAZOLAM 0.5 MG PO TABS
0.2500 mg | ORAL_TABLET | Freq: Once | ORAL | Status: AC | PRN
  Administered 2017-03-02: 0.25 mg via ORAL
  Filled 2017-03-02: qty 1

## 2017-03-02 MED ORDER — MAGNESIUM OXIDE 400 (241.3 MG) MG PO TABS
800.0000 mg | ORAL_TABLET | Freq: Once | ORAL | Status: AC
  Administered 2017-03-02: 10:00:00 800 mg via ORAL
  Filled 2017-03-02: qty 2

## 2017-03-02 NOTE — Progress Notes (Signed)
PT Cancellation Note  Patient Details Name: Shane Smith. MRN: [id number] DOB: 09/05/1961   Cancelled Treatment:    Reason Eval/Treat Not Completed: Patient not medically ready; per chart review, patient's calcium is 2.7 which is below the reference range for PT due to concern over possible arrhythmia and tetany. Will check back when patient's calcium is within appropriate levels for activity.    Katherina Right Mahlik Lenn 03/02/2017, 8:38 AM

## 2017-03-02 NOTE — Plan of Care (Signed)
Problem: Coping: Goal: Ability to identify strategies to decrease anxiety will improve Outcome: Progressing Wife given paper and pen to write down series of events for pt to reference.

## 2017-03-02 NOTE — Progress Notes (Signed)
*  PRELIMINARY RESULTS* Echocardiogram 2D Echocardiogram has been performed.  Garrel Ridgel Chasidy Janak 03/02/2017, 8:26 AM

## 2017-03-02 NOTE — Discharge Summary (Signed)
[facility] [place] [facility]   PATIENT NAME: Shane Smith    MR#:  [id number]  DATE OF BIRTH:  07-03-1961  DATE OF ADMISSION:  03/01/2017 ADMITTING PHYSICIAN: Shaune Pollack, MD  DATE OF DISCHARGE: 03/02/17  PRIMARY CARE PHYSICIAN: Marisue Ivan, MD    ADMISSION DIAGNOSIS:  Transient global amnesia [G45.4] Confusion [R41.0]  DISCHARGE DIAGNOSIS:  Amnesia-- transient resolved HTN   SECONDARY DIAGNOSIS:   Past Medical History:  Diagnosis Date  . Hypertension   . Sleep apnea     HOSPITAL COURSE:  Shane Smith  is a 56 y.o. male with a known history of HTN and sleep apnea. Per wife patient was sent to ED since he was noticed confusion by his wife today. He mowed the lawn, but he did not know what he had done earlier in the day.Per neurology consult, the patient may has Transient global amnesia, need observation. CT head is unremarkable.  1. Amnesia Resolved Transient CT head neg MRI done results pending No neuro deficits bp ok Cont asa  2. HTN  resumed home meds  3.Low K Replace  If mri negative then d/c home  CONSULTS OBTAINED:    DRUG ALLERGIES:  No Known Allergies  DISCHARGE MEDICATIONS:   Current Discharge Medication List    CONTINUE these medications which have NOT CHANGED   Details  amLODipine (NORVASC) 10 MG tablet Take 10 mg by mouth daily.    aspirin 81 MG chewable tablet Chew 81 mg by mouth daily.    atorvastatin (LIPITOR) 20 MG tablet Take 1 tablet by mouth daily.    benazepril (LOTENSIN) 40 MG tablet Take 40 mg by mouth daily.    hydrALAZINE (APRESOLINE) 25 MG tablet Take 25 mg by mouth 3 (three) times daily.    hydrochlorothiazide (HYDRODIURIL) 25 MG tablet Take 25 mg by mouth daily.    loratadine (CLARITIN) 10 MG tablet Take 10 mg by mouth daily as needed for allergies.    Multiple Vitamins-Minerals (MULTIVITAMIN PO) Take 1 tablet by mouth daily.    nebivolol (BYSTOLIC) 10 MG tablet Take 10 mg by  mouth daily.    Omega-3 Fatty Acids (FISH OIL PO) Take 2 capsules by mouth daily.    fluticasone (FLONASE) 50 MCG/ACT nasal spray Place 2 sprays into the nose daily.    naproxen sodium (ANAPROX) 220 MG tablet Take 440 mg by mouth every morning.      STOP taking these medications     aspirin EC 325 MG tablet      rosuvastatin (CRESTOR) 20 MG tablet         If you experience worsening of your admission symptoms, develop shortness of breath, life threatening emergency, suicidal or homicidal thoughts you must seek medical attention immediately by calling 911 or calling your MD immediately  if symptoms less severe.  You Must read complete instructions/literature along with all the possible adverse reactions/side effects for all the Medicines you take and that have been prescribed to you. Take any new Medicines after you have completely understood and accept all the possible adverse reactions/side effects.   Please note  You were cared for by a hospitalist during your hospital stay. If you have any questions about your discharge medications or the care you received while you were in the hospital after you are discharged, you can call the unit and asked to speak with the hospitalist on call if the hospitalist that took care of you is not available. Once you are discharged, your primary  care physician will handle any further medical issues. Please note that NO REFILLS for any discharge medications will be authorized once you are discharged, as it is imperative that you return to your primary care physician (or establish a relationship with a primary care physician if you do not have one) for your aftercare needs so that they can reassess your need for medications and monitor your lab values. Today   SUBJECTIVE    Doing well VITAL SIGNS:  Blood pressure 139/82, pulse 70, temperature 98.4 F (36.9 C), temperature source Oral, resp. rate 18, height  (1.753 m), weight (!) 138.9 kg (306 lb  4.8 oz), SpO2 97 %.  I/O:   Intake/Output Summary (Last 24 hours) at 03/02/17 1401 Last data filed at 03/02/17 0800  Gross per 24 hour  Intake           1932.5 ml  Output                0 ml  Net           1932.5 ml    PHYSICAL EXAMINATION:  GENERAL:  56 y.o.-year-old patient lying in the bed with no acute distress.  EYES: Pupils equal, round, reactive to light and accommodation. No scleral icterus. Extraocular muscles intact.  HEENT: Head atraumatic, normocephalic. Oropharynx and nasopharynx clear.  NECK:  Supple, no jugular venous distention. No thyroid enlargement, no tenderness.  LUNGS: Normal breath sounds bilaterally, no wheezing, rales,rhonchi or crepitation. No use of accessory muscles of respiration.  CARDIOVASCULAR: S1, S2 normal. No murmurs, rubs, or gallops.  ABDOMEN: Soft, non-tender, non-distended. Bowel sounds present. No organomegaly or mass.  EXTREMITIES: No pedal edema, cyanosis, or clubbing.  NEUROLOGIC: Cranial nerves II through XII are intact. Muscle strength 5/5 in all extremities. Sensation intact. Gait not checked.  PSYCHIATRIC: The patient is alert and oriented x 3.  SKIN: No obvious rash, lesion, or ulcer.   DATA REVIEW:   CBC   Recent Labs Lab 03/01/17 1316  WBC 6.9  HGB 14.6  HCT 43.8  PLT 183    Chemistries   Recent Labs Lab 03/01/17 1316 03/01/17 1321  NA 135  --   K 2.7*  --   CL 100*  --   CO2 25  --   GLUCOSE 137*  --   BUN 15  --   CREATININE 0.72  --   CALCIUM 8.6*  --   MG  --  1.6*  AST 29  --   ALT 24  --   ALKPHOS 61  --   BILITOT 0.7  --     Microbiology Results   No results found for this or any previous visit (from the past 240 hour(s)).  RADIOLOGY:  US Carotid Bilateral (at [facility])  Result Date: 03/01/2017 CLINICAL DATA:  56 year old male with a history of amnesia. Cardiovascular risk factors include hypertension, hyperlipidemia, tobacco use EXAM: BILATERAL CAROTID DUPLEX ULTRASOUND TECHNIQUE: Wallace Cullens  scale imaging, color Doppler and duplex ultrasound were performed of bilateral carotid and vertebral arteries in the neck. COMPARISON:  03/23/2015 FINDINGS: Criteria: Quantification of carotid stenosis is based on velocity parameters that correlate the residual internal carotid diameter with NASCET-based stenosis levels, using the diameter of the distal internal carotid lumen as the denominator for stenosis measurement. The following velocity measurements were obtained: RIGHT ICA:  Systolic 69 cm/sec, Diastolic 10 cm/sec CCA:  93 cm/sec SYSTOLIC ICA/CCA RATIO:  0.7 ECA:  133 cm/sec LEFT ICA:  Systolic 75 cm/sec, Diastolic 18  cm/sec CCA:  134 cm/sec SYSTOLIC ICA/CCA RATIO:  0.6 ECA:  185 cm/sec Right Brachial SBP: Not acquired Left Brachial SBP: Not acquired RIGHT CAROTID ARTERY: No significant calcified disease of the right common carotid artery. Intermediate waveform maintained. Heterogeneous plaque without significant calcifications at the right carotid bifurcation. Low resistance waveform of the right ICA. Mild tortuosity. RIGHT VERTEBRAL ARTERY: Antegrade flow with low resistance waveform. LEFT CAROTID ARTERY: No significant calcified disease of the left common carotid artery. Intermediate waveform maintained. Heterogeneous plaque at the left carotid bifurcation without significant calcifications. Low resistance waveform of the left ICA. Mild tortuosity LEFT VERTEBRAL ARTERY:  Antegrade flow with low resistance waveform. IMPRESSION: Color duplex indicates minimal heterogeneous plaque, with no hemodynamically significant stenosis by duplex criteria in the extracranial cerebrovascular circulation. Signed, Yvone Neu. Loreta Ave, DO [facility] Radiology Electronically Signed   By: Gilmer Mor D.O.   On: 03/01/2017 18:14   Ct Head Code Stroke Wo Contrast`  Addendum Date: 03/01/2017   ADDENDUM REPORT: 03/01/2017 14:11 ADDENDUM: These results were called by telephone at  the time of interpretation on 03/01/2017 at 1:54 PM to Dr. Jene Every , who verbally acknowledged these results. Electronically Signed   By: Deatra Robinson M.D.   On: 03/01/2017 14:11   Result Date: 03/01/2017 CLINICAL DATA:  Code stroke. Hypertension, altered mental status and repetitive speech. EXAM: CT HEAD WITHOUT CONTRAST TECHNIQUE: Contiguous axial images were obtained from the base of the skull through the vertex without intravenous contrast. COMPARISON:  Head CT 05/20/2015 FINDINGS: Brain: No mass lesion, intraparenchymal hemorrhage or extra-axial collection. No evidence of acute cortical infarct. Brain parenchyma and CSF-containing spaces are normal for age. Vascular: No hyperdense vessel or unexpected calcification. Skull: Normal visualized skull base, calvarium and extracranial soft tissues. Sinuses/Orbits: No sinus fluid levels or advanced mucosal thickening. No mastoid effusion. Normal orbits. ASPECTS [facility] Stroke Program Early CT Score) - Ganglionic level infarction (caudate, lentiform nuclei, internal capsule, insula, M1-M3 cortex): 7 - Supraganglionic infarction (M4-M6 cortex): 3 Total score (0-10 with 10 being normal): 10 IMPRESSION: 1. Normal head CT. 2. ASPECTS is 10. I am currently attempting to contact Dr. Cyril Loosen at 1:46 p.m. Electronically Signed: By: Deatra Robinson M.D. On: 03/01/2017 13:47     Management plans discussed with the patient, family and they are in agreement.  CODE STATUS:     Code Status Orders        Start     Ordered   03/01/17 1558  Full code  Continuous     03/01/17 1557    Code Status History    Date Active Date Inactive Code Status Order ID Comments User Context   03/22/2015 11:19 PM 03/23/2015  7:07 PM Full Code [id number]  Oralia Manis, MD Inpatient      TOTAL TIME TAKING CARE OF THIS PATIENT: *40* minutes.    Brette Cast M.D on 03/02/2017 at 2:01 PM  Between 7am to 6pm - Pager - [phone] After 6pm go to [email or web address] - Air traffic controller  [facility]  Office  [phone]  CC: Primary care physician; Marisue Ivan, MD

## 2017-03-02 NOTE — Progress Notes (Signed)
Pt with short term memory loss.  Pt did not remember this nurse at change of shift or that family was at bedside.  Family left at 8 PM and when I entered the room to check on him, he did not know how he got to the hospital or how long he had been here.  Reoriented pt but pt asked to call his wife.  After he spoke with her, she came back and sat with pt.  We wrote down what had happened for pt to reference but pt still repeats same questions.  Food in front of pt but he forgets to eat, seems surprised when he is redirected to eat. Pt prefers to be up in chair so is sitting up in recliner with chair alarm, door open and frequent checks. NIH (0). No measure for memory loss/ amnesia. Henriette Combs RN

## 2017-03-03 LAB — HEMOGLOBIN A1C
Hgb A1c MFr Bld: 5.8 % — ABNORMAL HIGH (ref 4.8–5.6)
Mean Plasma Glucose: 120 mg/dL

## 2017-03-03 LAB — HIV ANTIBODY (ROUTINE TESTING W REFLEX): HIV Screen 4th Generation wRfx: NONREACTIVE

## 2017-03-03 LAB — ECHOCARDIOGRAM COMPLETE
HEIGHTINCHES: 69 in
WEIGHTICAEL: 4900.8 [oz_av]

## 2018-07-06 IMAGING — MR MR HEAD W/O CM
9 of 11 series · 31 of 48 positions shown · non-contrast
Comparison: Head CT 03/01/2017.  Head MRI/ MRA 03/23/2015.

CLINICAL DATA: Transient global amnesia.

EXAM:
MRI HEAD WITHOUT CONTRAST
MRA HEAD WITHOUT CONTRAST
TECHNIQUE: Multiplanar, multiecho pulse sequences of the brain and surrounding
structures were obtained without intravenous contrast. Angiographic
images of the head were obtained using MRA technique without
contrast.

[Series 2: T1 · sagittal · 5.0mm · 0.45mm/px · 3 of 27 slices shown]
[im 1/27]
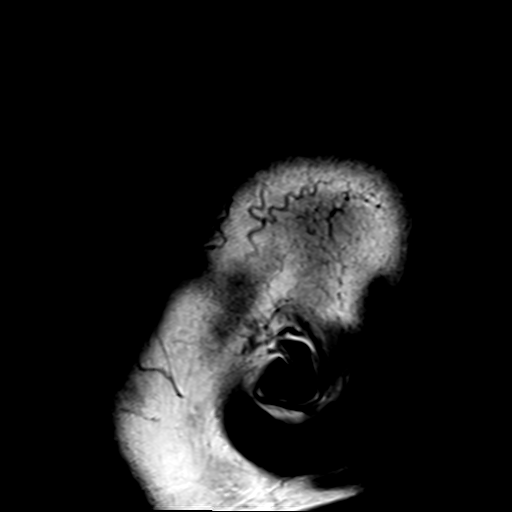
[im 14/27]
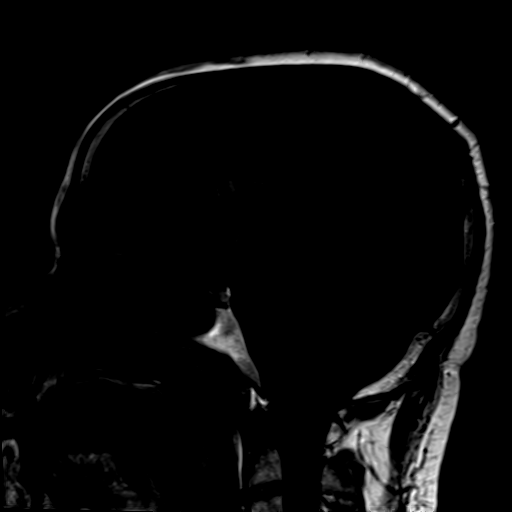
[im 27/27]
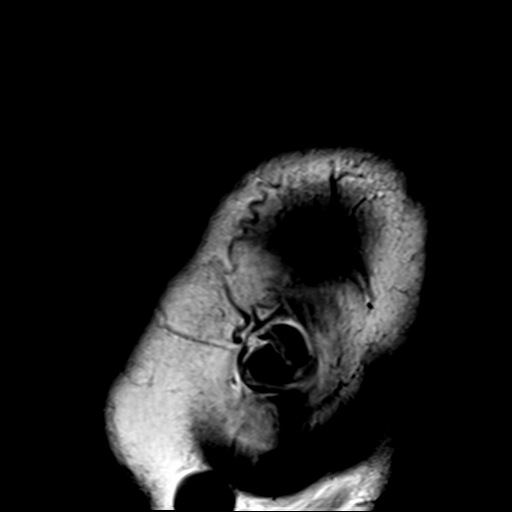

[Series 4: DWI · axial · 3.0mm · 1.80mm/px · z∈[-42,+118]mm · 6 of 51 slices shown (1 of 2)]
[im 1/51]
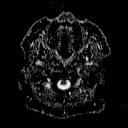
[im 11/51]
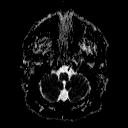
[im 21/51]
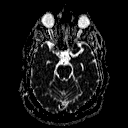
[im 31/51]
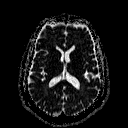
[im 41/51]
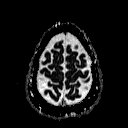
[im 51/51]
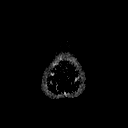

[Series 6: DWI · coronal · 3.0mm · 1.80mm/px · 4 of 45 slices shown (2 of 2)]
[im 1/45]
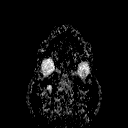
[im 15/45]
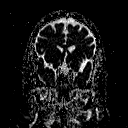
[im 30/45]
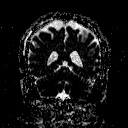
[im 45/45]
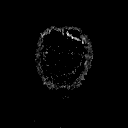

[Series 7: T2 · axial · 5.0mm · 0.45mm/px · z∈[-46,+121]mm · 2 of 27 slices shown (1 of 3)]
[im 1/27]
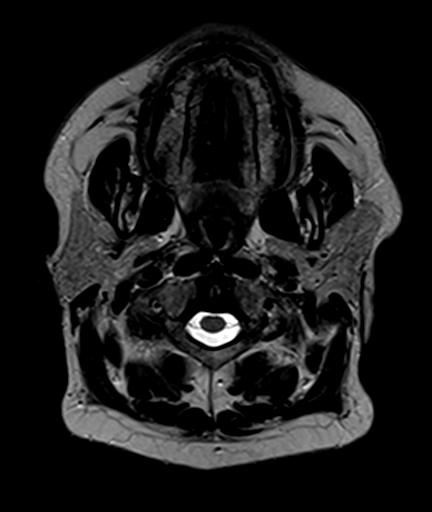
[im 27/27]
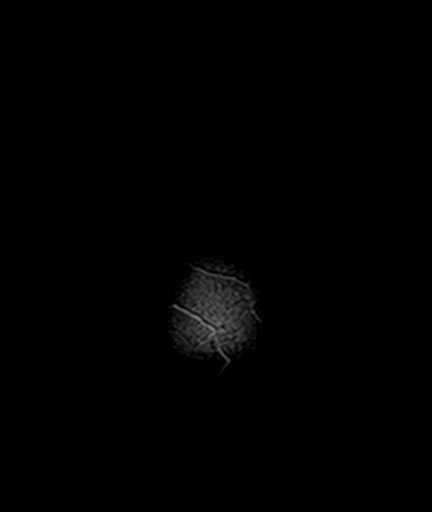

[Series 8: FLAIR · axial · 3.0mm · 0.45mm/px · z∈[-40,+115]mm · 5 of 53 slices shown]
[im 1/53]
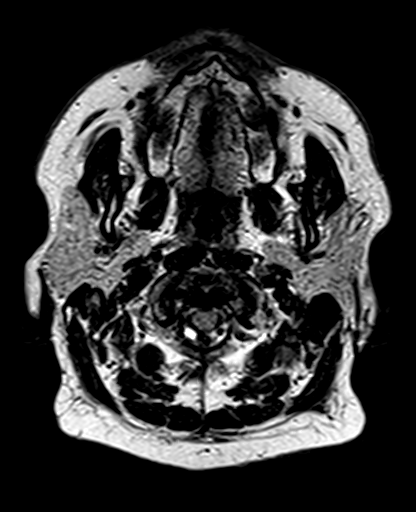
[im 14/53]
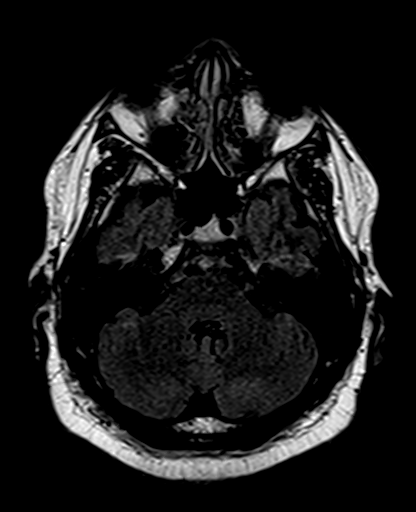
[im 27/53]
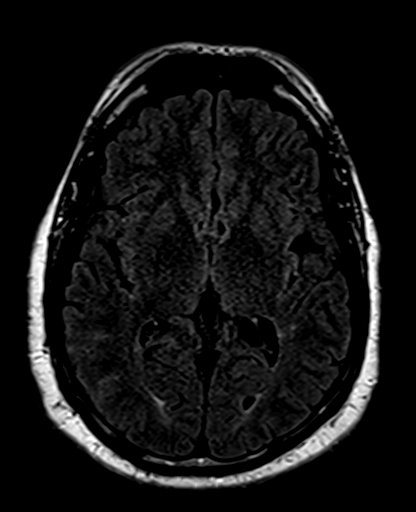
[im 40/53]
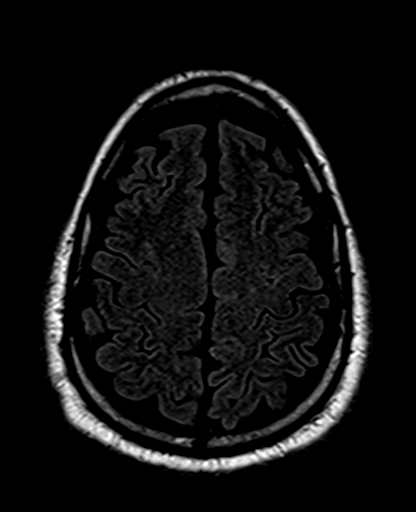
[im 53/53]
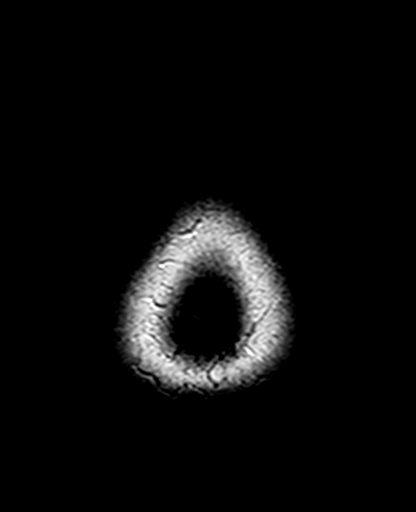

[Series 9: TOF · axial · non-contrast · 0.7mm · 0.37mm/px · z∈[-43,+14]mm · 5 of 143 slices shown]
[im 1/143]
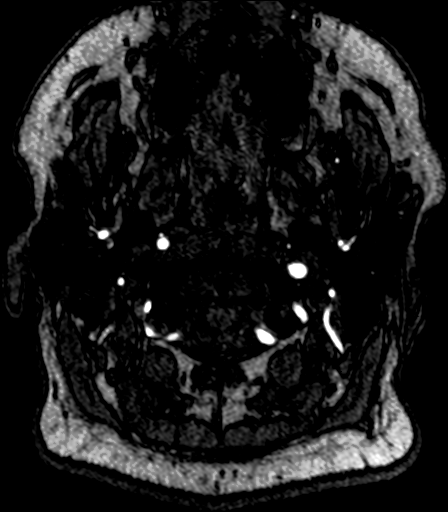
[im 24/143]
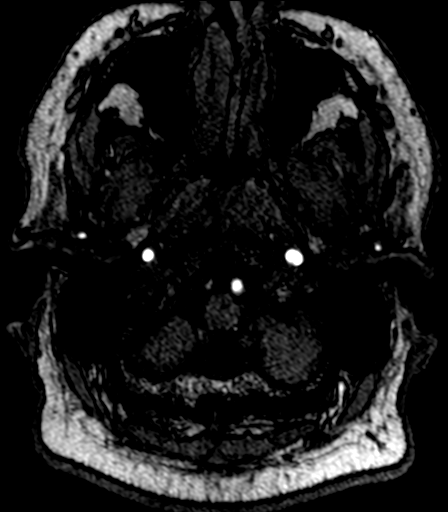
[im 48/143]
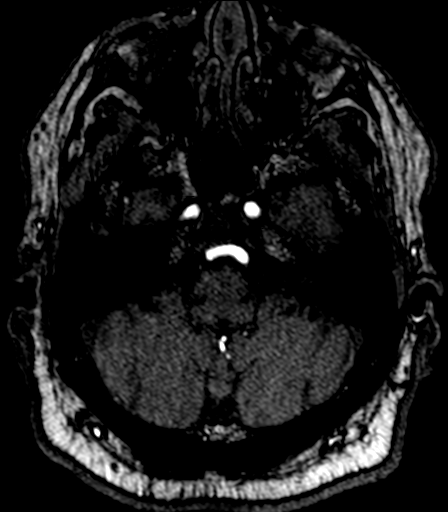
[im 60/143]
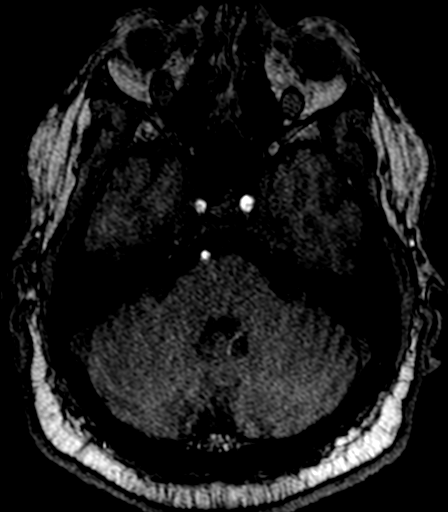
[im 83/143]
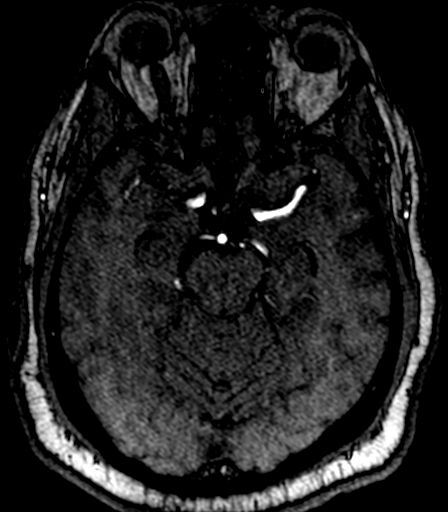

[Series 13: T2 · axial · 5.0mm · 1.20mm/px · z∈[-45,+122]mm · 2 of 27 slices shown (2 of 3)]
[im 1/27]
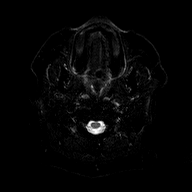
[im 27/27]
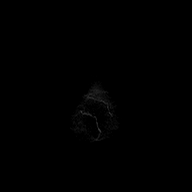

[Series 14: GRE · axial · 5.0mm · 0.45mm/px · z∈[-39,+115]mm · 2 of 25 slices shown]
[im 1/25]
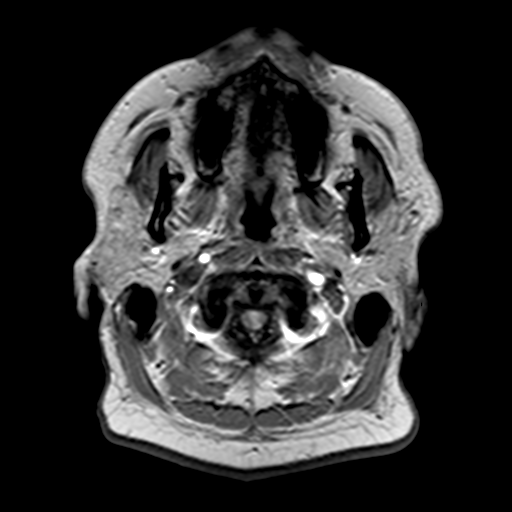
[im 25/25]
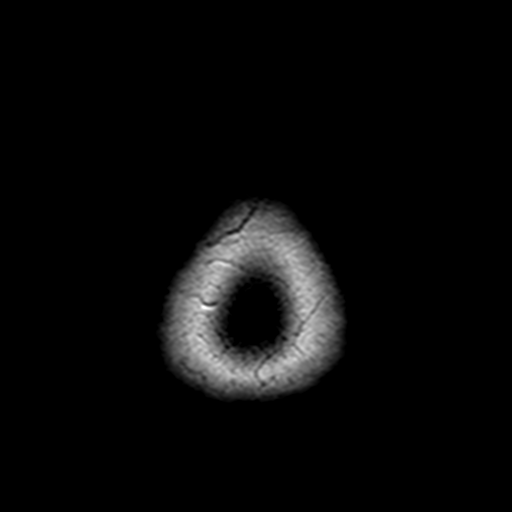

[Series 15: T2 · coronal · 5.0mm · 0.45mm/px · 2 of 27 slices shown (3 of 3)]
[im 1/27]
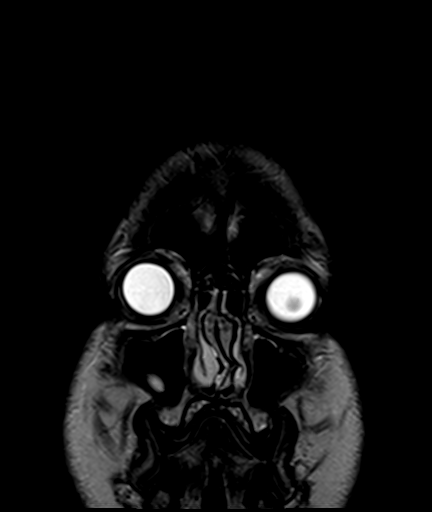
[im 27/27]
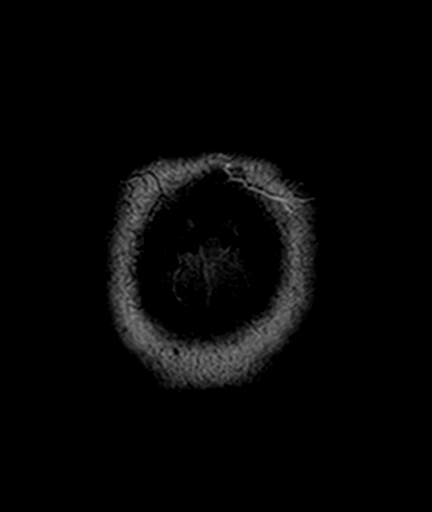

[31 of 48 positions shown; findings below may reference images not displayed]

FINDINGS: MRI HEAD FINDINGS

Brain: There is no evidence of acute infarct, intracranial
hemorrhage, mass, midline shift, or extra-axial fluid collection.
The ventricles and sulci are normal. Minimal periventricular white
matter T2 hyperintensity is predominantly present in the left
periatrial region, similar to the prior MRI and nonspecific.

Vascular: Major intracranial vascular flow voids are preserved.

Skull and upper cervical spine: Unremarkable bone marrow signal.

Sinuses/Orbits: No significant findings.

Other: Similar appearance of subcutaneous T1 hypointense, T2
hyperintense mass in the lateral right face measuring at least
cm in size, most likely a sebaceous cyst as it appears separate from
the adjacent parotid tail.

MRA HEAD FINDINGS

The visualized distal vertebral arteries are patent with the left
being strongly dominant. PICA, AICA, and SCA origins are patent with
a common origin of the left PCA and left SCA noted. Basilar artery
is widely patent. There is a small right posterior communicating
artery. PCAs are patent without evidence of significant stenosis.

The internal carotid arteries are widely patent from skullbase to
carotid termini. The right A1 segment is markedly hypoplastic. Left
A1 segment is widely patent, as is the anterior communicating
artery. ACAs and MCAs are patent without evidence of proximal branch
occlusion or significant proximal stenosis. There is improved
(though still asymmetrically reduced compared to the contralateral
side) flow related signal in the right M1 and proximal M2 segments
compared to the prior MRI, most compatible with artifact. No
intracranial aneurysm is identified.
IMPRESSION: 1. No acute intracranial abnormality.
2. Minimal cerebral white matter T2 signal changes, nonspecific and
unchanged from 8265.
3. No medium or large vessel occlusion or significant proximal
stenosis.

## 2023-10-14 ENCOUNTER — Encounter: Payer: Self-pay | Admitting: *Deleted

## 2023-11-17 ENCOUNTER — Encounter
Admission: RE | Disposition: A | Discharge status: Home / Self Care | Payer: Self-pay | Source: Home / Self Care | Attending: Gastroenterology

## 2023-11-17 ENCOUNTER — Encounter: Payer: Self-pay | Admitting: *Deleted

## 2023-11-17 ENCOUNTER — Ambulatory Visit: Payer: 59 | Admitting: Anesthesiology

## 2023-11-17 ENCOUNTER — Ambulatory Visit
Admission: RE | Admit: 2023-11-17 | Discharge: 2023-11-17 | Disposition: A | Discharge status: Home / Self Care | Payer: 59 | Attending: Gastroenterology | Admitting: Gastroenterology

## 2023-11-17 DIAGNOSIS — Z1211 Encounter for screening for malignant neoplasm of colon: Secondary | ICD-10-CM | POA: Insufficient documentation

## 2023-11-17 DIAGNOSIS — Z87891 Personal history of nicotine dependence: Secondary | ICD-10-CM | POA: Insufficient documentation

## 2023-11-17 DIAGNOSIS — K5730 Diverticulosis of large intestine without perforation or abscess without bleeding: Secondary | ICD-10-CM | POA: Diagnosis not present

## 2023-11-17 DIAGNOSIS — G4733 Obstructive sleep apnea (adult) (pediatric): Secondary | ICD-10-CM | POA: Diagnosis not present

## 2023-11-17 DIAGNOSIS — D123 Benign neoplasm of transverse colon: Secondary | ICD-10-CM | POA: Insufficient documentation

## 2023-11-17 DIAGNOSIS — E66813 Obesity, class 3: Secondary | ICD-10-CM | POA: Insufficient documentation

## 2023-11-17 DIAGNOSIS — I10 Essential (primary) hypertension: Secondary | ICD-10-CM | POA: Insufficient documentation

## 2023-11-17 DIAGNOSIS — K640 First degree hemorrhoids: Secondary | ICD-10-CM | POA: Insufficient documentation

## 2023-11-17 DIAGNOSIS — D122 Benign neoplasm of ascending colon: Secondary | ICD-10-CM | POA: Diagnosis not present

## 2023-11-17 DIAGNOSIS — Z6841 Body Mass Index (BMI) 40.0 and over, adult: Secondary | ICD-10-CM | POA: Insufficient documentation

## 2023-11-17 HISTORY — DX: Pure hypercholesterolemia, unspecified: E78.00

## 2023-11-17 HISTORY — DX: Transient global amnesia: G45.4

## 2023-11-17 HISTORY — PX: POLYPECTOMY: SHX5525

## 2023-11-17 HISTORY — DX: Prediabetes: R73.03

## 2023-11-17 HISTORY — PX: COLONOSCOPY WITH PROPOFOL: SHX5780

## 2023-11-17 HISTORY — DX: Benign prostatic hyperplasia with lower urinary tract symptoms: N40.1

## 2023-11-17 SURGERY — COLONOSCOPY WITH PROPOFOL
Anesthesia: General

## 2023-11-17 MED ORDER — DEXMEDETOMIDINE HCL IN NACL 80 MCG/20ML IV SOLN
INTRAVENOUS | Status: DC | PRN
  Administered 2023-11-17: 20 ug via INTRAVENOUS

## 2023-11-17 MED ORDER — SODIUM CHLORIDE 0.9 % IV SOLN: INTRAVENOUS | Status: DC

## 2023-11-17 MED ORDER — LIDOCAINE HCL (CARDIAC) PF 100 MG/5ML IV SOSY
PREFILLED_SYRINGE | INTRAVENOUS | Status: DC | PRN
  Administered 2023-11-17: 100 mg via INTRAVENOUS

## 2023-11-17 MED ORDER — PROPOFOL 10 MG/ML IV BOLUS
INTRAVENOUS | Status: DC | PRN
  Administered 2023-11-17: 30 mg via INTRAVENOUS
  Administered 2023-11-17 (×2): 20 mg via INTRAVENOUS
  Administered 2023-11-17: 50 mg via INTRAVENOUS

## 2023-11-17 MED ORDER — PROPOFOL 500 MG/50ML IV EMUL
INTRAVENOUS | Status: DC | PRN
  Administered 2023-11-17: 75 ug/kg/min via INTRAVENOUS

## 2023-11-17 NOTE — Interval H&P Note (Signed)
 History and Physical Interval Note:  11/17/2023 8:51 AM  Shane Smith.  has presented today for surgery, with the diagnosis of Z12.11 (ICD-10-CM) - Colon cancer screening.  The various methods of treatment have been discussed with the patient and family. After consideration of risks, benefits and other options for treatment, the patient has consented to  Procedure(s): COLONOSCOPY WITH PROPOFOL  (N/A) as a surgical intervention.  The patient's history has been reviewed, patient examined, no change in status, stable for surgery.  I have reviewed the patient's chart and labs.  Questions were answered to the patient's satisfaction.     Ole ONEIDA Schick  Ok to proceed with colonoscopy

## 2023-11-17 NOTE — Transfer of Care (Signed)
 Immediate Anesthesia Transfer of Care Note  Patient: Shane Smith.  Procedure(s) Performed: COLONOSCOPY WITH PROPOFOL  POLYPECTOMY  Patient Location: PACU  Anesthesia Type:General  Level of Consciousness: sedated  Airway & Oxygen Therapy: Patient Spontanous Breathing  Post-op Assessment: Report given to RN and Post -op Vital signs reviewed and stable  Post vital signs: Reviewed and stable  Last Vitals:  Vitals Value Taken Time  BP 115/60 11/17/23 0926  Temp    Pulse    Resp    SpO2      Last Pain:  Vitals:   11/17/23 0748  TempSrc: Temporal  PainSc: 0-No pain         Complications: No notable events documented.

## 2023-11-17 NOTE — Anesthesia Postprocedure Evaluation (Signed)
 Anesthesia Post Note  Patient: Telly Stantz Jr.  Procedure(s) Performed: COLONOSCOPY WITH PROPOFOL  POLYPECTOMY  Patient location during evaluation: Endoscopy Anesthesia Type: General Level of consciousness: awake and alert Pain management: pain level controlled Vital Signs Assessment: post-procedure vital signs reviewed and stable Respiratory status: spontaneous breathing, nonlabored ventilation, respiratory function stable and patient connected to nasal cannula oxygen Cardiovascular status: blood pressure returned to baseline and stable Postop Assessment: no apparent nausea or vomiting Anesthetic complications: no  No notable events documented.   Last Vitals:  Vitals:   11/17/23 0748 11/17/23 0926  BP: (!) 180/98 115/60  Pulse: 73 66  Resp: 18 16  Temp: (!) 35.8 C (!) 36.1 C  SpO2: 99% 98%    Last Pain:  Vitals:   11/17/23 0926  TempSrc: Temporal  PainSc: 0-No pain                 Debby Mines

## 2023-11-17 NOTE — H&P (Signed)
 Outpatient short stay form Pre-procedure 11/17/2023  Shane ONEIDA Schick, MD  Primary Physician: Alla Amis, MD  Reason for visit:  Screening  History of present illness:    63 y/o gentleman with history of hypertension, obesity, and OSA here for screening colonoscopy. Last colonoscopy 10 years ago was normal. No blood thinners. No family history of GI malignancies. No significant abdominal surgeries.    Current Facility-Administered Medications:    0.9 %  sodium chloride  infusion, , Intravenous, Continuous, Raelie Lohr, Shane ONEIDA, MD, Last Rate: 20 mL/hr at 11/17/23 0804, New Bag at 11/17/23 0804  Medications Prior to Admission  Medication Sig Dispense Refill Last Dose/Taking   amLODipine  (NORVASC ) 10 MG tablet Take 10 mg by mouth daily.   11/16/2023   hydrALAZINE  (APRESOLINE ) 25 MG tablet Take 25 mg by mouth 3 (three) times daily.   11/16/2023   hydrochlorothiazide  (HYDRODIURIL ) 25 MG tablet Take 25 mg by mouth daily.   11/16/2023   nebivolol  (BYSTOLIC ) 10 MG tablet Take 10 mg by mouth daily.   11/16/2023   aspirin  81 MG chewable tablet Chew 81 mg by mouth daily.      atorvastatin  (LIPITOR) 20 MG tablet Take 1 tablet by mouth daily.      benazepril  (LOTENSIN ) 40 MG tablet Take 40 mg by mouth daily.      fluticasone  (FLONASE ) 50 MCG/ACT nasal spray Place 2 sprays into the nose daily.      loratadine (CLARITIN) 10 MG tablet Take 10 mg by mouth daily as needed for allergies.      Multiple Vitamins-Minerals (MULTIVITAMIN PO) Take 1 tablet by mouth daily.      naproxen sodium (ANAPROX) 220 MG tablet Take 440 mg by mouth every morning.      Omega-3 Fatty Acids (FISH OIL PO) Take 2 capsules by mouth daily.        No Known Allergies   Past Medical History:  Diagnosis Date   Amnesia, global, transient    Benign prostatic hyperplasia with nocturia    Hypertension    Pre-diabetes    Pure hypercholesterolemia    Sleep apnea     Review of systems:  Otherwise negative.    Physical  Exam  Gen: Alert, oriented. Appears stated age.  HEENT: PERRLA. Lungs: No respiratory distress CV: RRR Abd: soft, benign, no masses Ext: No edema    Planned procedures: Proceed with colonoscopy. The patient understands the nature of the planned procedure, indications, risks, alternatives and potential complications including but not limited to bleeding, infection, perforation, damage to internal organs and possible oversedation/side effects from anesthesia. The patient agrees and gives consent to proceed.  Please refer to procedure notes for findings, recommendations and patient disposition/instructions.     Shane ONEIDA Schick, MD [facility]

## 2023-11-17 NOTE — Op Note (Signed)
 [facility] Gastroenterology Patient Name: Shane Smith Procedure Date: 11/17/2023 8:43 AM MRN: [id number] Account #: [id number] Date of Birth: 09/26/1961 Admit Type: Outpatient Age: 63 Room: [facility] ENDO ROOM 3 Gender: Male Note Status: Finalized Instrument Name: Colonoscope [id number] Procedure:             Colonoscopy Indications:           Screening for colorectal malignant neoplasm Providers:             Ole Schick MD, MD Referring MD:          Alda Carpen (Referring MD) Medicines:             Monitored Anesthesia Care Complications:         No immediate complications. Estimated blood loss:                         Minimal. Procedure:             Pre-Anesthesia Assessment:                        - Prior to the procedure, a History and Physical was                         performed, and patient medications and allergies were                         reviewed. The patient is competent. The risks and                         benefits of the procedure and the sedation options and                         risks were discussed with the patient. All questions                         were answered and informed consent was obtained.                         Patient identification and proposed procedure were                         verified by the physician, the nurse, the                         anesthesiologist, the anesthetist and the technician                         in the endoscopy suite. Mental Status Examination:                         alert and oriented. Airway Examination: normal                         oropharyngeal airway and neck mobility. Respiratory                         Examination: clear to auscultation. CV Examination:  normal. Prophylactic Antibiotics: The patient does not                         require prophylactic antibiotics. Prior                         Anticoagulants: The patient has taken no anticoagulant                          or antiplatelet agents. ASA Grade Assessment: III - A                         patient with severe systemic disease. After reviewing                         the risks and benefits, the patient was deemed in                         satisfactory condition to undergo the procedure. The                         anesthesia plan was to use monitored anesthesia care                         (MAC). Immediately prior to administration of                         medications, the patient was re-assessed for adequacy                         to receive sedatives. The heart rate, respiratory                         rate, oxygen saturations, blood pressure, adequacy of                         pulmonary ventilation, and response to care were                         monitored throughout the procedure. The physical                         status of the patient was re-assessed after the                         procedure.                        After obtaining informed consent, the colonoscope was                         passed under direct vision. Throughout the procedure,                         the patient's blood pressure, pulse, and oxygen                         saturations were monitored continuously. The  Colonoscope was introduced through the anus and                         advanced to the the cecum, identified by appendiceal                         orifice and ileocecal valve. The colonoscopy was                         somewhat difficult due to significant looping.                         Successful completion of the procedure was aided by                         applying abdominal pressure. The patient tolerated the                         procedure well. The quality of the bowel preparation                         was adequate to identify polyps. The ileocecal valve,                         appendiceal orifice, and rectum were photographed. Findings:      The  perianal and digital rectal examinations were normal.      A 5 mm polyp was found in the ascending colon. The polyp was sessile.       The polyp was removed with a cold snare. Resection and retrieval were       complete. Estimated blood loss was minimal.      A 2 mm polyp was found in the hepatic flexure. The polyp was sessile.       The polyp was removed with a cold snare. Resection and retrieval were       complete. Estimated blood loss was minimal.      Scattered large-mouthed and small-mouthed diverticula were found in the       sigmoid colon, transverse colon, hepatic flexure, ascending colon and       cecum.      Internal hemorrhoids were found during retroflexion. The hemorrhoids       were Grade I (internal hemorrhoids that do not prolapse).      The exam was otherwise without abnormality on direct and retroflexion       views. Impression:            - One 5 mm polyp in the ascending colon, removed with                         a cold snare. Resected and retrieved.                        - One 2 mm polyp at the hepatic flexure, removed with                         a cold snare. Resected and retrieved.                        - Diverticulosis in the  sigmoid colon, in the                         transverse colon, at the hepatic flexure, in the                         ascending colon and in the cecum.                        - Internal hemorrhoids.                        - The examination was otherwise normal on direct and                         retroflexion views. Recommendation:        - Discharge patient to home.                        - Resume previous diet.                        - Continue present medications.                        - Await pathology results.                        - Repeat colonoscopy in 7 years for surveillance.                        - Return to referring physician as previously                         scheduled. Procedure Code(s):     --- Professional  ---                        [phone], Colonoscopy, flexible; with removal of                         tumor(s), polyp(s), or other lesion(s) by snare                         technique Diagnosis Code(s):     --- Professional ---                        Z12.11, Encounter for screening for malignant neoplasm                         of colon                        D12.2, Benign neoplasm of ascending colon                        D12.3, Benign neoplasm of transverse colon (hepatic                         flexure or splenic flexure)  K64.0, First degree hemorrhoids                        K57.30, Diverticulosis of large intestine without                         perforation or abscess without bleeding CPT copyright 2022 American [employer] Association. All rights reserved. The codes documented in this report are preliminary and upon coder review may  be revised to meet current compliance requirements. Ole Schick MD, MD 11/17/2023 9:28:13 AM Number of Addenda: 0 Note Initiated On: 11/17/2023 8:43 AM Scope Withdrawal Time: 0 hours 11 minutes 4 seconds  Total Procedure Duration: 0 hours 20 minutes 50 seconds  Estimated Blood Loss:  Estimated blood loss was minimal.      [facility]

## 2023-11-17 NOTE — Anesthesia Preprocedure Evaluation (Signed)
 Anesthesia Evaluation  Patient identified by MRN, date of birth, ID band Patient awake    Reviewed: Allergy & Precautions, NPO status , Patient's Chart, lab work & pertinent test results  History of Anesthesia Complications Negative for: history of anesthetic complications  Airway Mallampati: IV   Neck ROM: Full    Dental  (+) Upper Dentures, Lower Dentures   Pulmonary sleep apnea , former smoker (quit 1997)   Pulmonary exam normal breath sounds clear to auscultation       Cardiovascular hypertension, Normal cardiovascular exam Rhythm:Regular Rate:Normal     Neuro/Psych negative neurological ROS     GI/Hepatic negative GI ROS,,,  Endo/Other    Class 3 obesityPrediabetes   Renal/GU negative Renal ROS     Musculoskeletal   Abdominal   Peds  Hematology negative hematology ROS (+)   Anesthesia Other Findings   Reproductive/Obstetrics                             Anesthesia Physical Anesthesia Plan  ASA: 3  Anesthesia Plan: General   Post-op Pain Management:    Induction: Intravenous  PONV Risk Score and Plan: 2 and Propofol  infusion, TIVA and Treatment may vary due to age or medical condition  Airway Management Planned: Natural Airway  Additional Equipment:   Intra-op Plan:   Post-operative Plan:   Informed Consent: I have reviewed the patients History and Physical, chart, labs and discussed the procedure including the risks, benefits and alternatives for the proposed anesthesia with the patient or authorized representative who has indicated his/her understanding and acceptance.       Plan Discussed with: CRNA  Anesthesia Plan Comments: (LMA/GETA backup discussed.  Patient consented for risks of anesthesia including but not limited to:  - adverse reactions to medications - damage to eyes, teeth, lips or other oral mucosa - nerve damage due to positioning  - sore throat  or hoarseness - damage to heart, brain, nerves, lungs, other parts of body or loss of life  Informed patient about role of CRNA in peri- and intra-operative care.  Patient voiced understanding.)       Anesthesia Quick Evaluation

## 2023-11-18 ENCOUNTER — Encounter: Payer: Self-pay | Admitting: Gastroenterology

## 2023-11-18 LAB — SURGICAL PATHOLOGY

## 2024-11-09 ENCOUNTER — Ambulatory Visit

## 2024-11-09 DIAGNOSIS — B351 Tinea unguium: Secondary | ICD-10-CM

## 2024-11-09 NOTE — Progress Notes (Signed)
" °  °  Subjective   Shane Smith. is a 63 y.o. male who presents for the following: Nail deformity. Patient is new patient  Today patient reports: Nail deformity on the first and fourth fingers of the right hand.   Review of Systems:    No other skin or systemic complaints except as noted in HPI or Assessment and Plan.  The following portions of the chart were reviewed this encounter and updated as appropriate: medications, allergies, medical history  Relevant Medical History:  n/a   Objective  (SKPE) Well appearing patient in no apparent distress; mood and affect are within normal limits. Examination was performed of the: Focused Exam of: Right hand   Examination notable for: Right first and fourth finger with yellowing and significant subungual debris and onycholysis      Assessment & Plan  (SKAP)   Nail dystrophy of R 1st and 4th finger nail - favor Onychomycosis - Discussed diagnosis, typical course, and treatment options for this condition -Nail-Fungal-ID/Derm-ID Molecular Diagnostic test performed today.  Discussed with patient their insurance will be billed.  Advised the patient they may get a bill for a portion that's not covered by their insurance.  Should the patient have any issues with their remaining responsibility they will not be sent to collections but KRISTINE will work with them internally on any remaining balance.   - Discussed pending results would recommend initiation of oral antifungal   - reviewed CMP from 10/2024 with normal renal, liver function   - denies alcohol use    Was sun protection counseling provided?: No   Level of service outlined above   Patient instructions (SKPI)   Procedures, orders, diagnosis for this visit:    There are no diagnoses linked to this encounter.  Return to clinic: Return if symptoms worsen or fail to improve.  I, Emerick Ege, CMA am acting as scribe for Lauraine JAYSON Kanaris, MD.   Documentation: I have reviewed  the above documentation for accuracy and completeness, and I agree with the above.  Lauraine JAYSON Kanaris, MD  "

## 2024-11-09 NOTE — Patient Instructions (Signed)

## 2024-11-23 ENCOUNTER — Ambulatory Visit: Payer: Self-pay

## 2024-11-23 NOTE — Progress Notes (Signed)
 Please notify patient with below plan: - Clipping positive for fungus  - Options for treatment include:         - Oral terbinafine 250 daily for 6 weeks: interacts / causes increased levels of nebivolol  (can lead to low heart rate, low blood pressures). Cannot drink on this medication.         - Oral fluconazole 150 mg weekly x 3-6 months: interacts/causes increased levels of atorvastatin  (risk of myopathy, rhabodomyolysis)   - Shane Smith will need to discuss with his PCP if ok to hold either of these during treatment of fungus or just monitor.   In the meantime, can just do penlac  - please send to pharmacy.  Thx!

## 2024-11-25 MED ORDER — CICLOPIROX 8 % EX SOLN: Freq: Every day | CUTANEOUS | 11 refills | Status: AC

## 2024-11-25 NOTE — Telephone Encounter (Signed)
 Discussed culture results with patient and advised him to discuss with PCP. Penlac  sent to pharmacy.

## 2024-11-25 NOTE — Telephone Encounter (Signed)
-----   Message from Lauraine Kanaris, MD sent at 11/23/2024  3:44 PM EST ----- Please notify patient with below plan: - Clipping positive for fungus  - Options for treatment include:         - Oral terbinafine 250 daily for 6 weeks: interacts / causes increased levels of nebivolol  (can lead to low heart rate, low blood pressures). Cannot drink on this medication.         - Oral fluconazole 150 mg weekly x 3-6 months: interacts/causes increased levels of atorvastatin  (risk of myopathy, rhabodomyolysis)   - he will need to discuss with his PCP if ok to hold either of these during treatment of fungus or just monitor.   In the meantime, can just do penlac  - please send to pharmacy.  Thx!

## 2024-12-02 ENCOUNTER — Telehealth: Payer: Self-pay

## 2024-12-02 NOTE — Telephone Encounter (Signed)
 Insurance is requesting fungal diagnostic test to be completed before Ciclopirox  solution will be approved. Called patient but no answer and VM full.   RX available with [employer] coupon through [employer] for $23.04.
